# Patient Record
Sex: Male | Born: 1945 | Race: White | Hispanic: No | Marital: Married | Smoking: Former smoker
Health system: Southern US, Community
[De-identification: ages and names within clinical notes are randomized; demographics above are authoritative.]

## PROBLEM LIST (undated history)

## (undated) DIAGNOSIS — E119 Type 2 diabetes mellitus without complications: Secondary | ICD-10-CM

## (undated) DIAGNOSIS — G4730 Sleep apnea, unspecified: Secondary | ICD-10-CM

## (undated) DIAGNOSIS — C801 Malignant (primary) neoplasm, unspecified: Secondary | ICD-10-CM

## (undated) DIAGNOSIS — J449 Chronic obstructive pulmonary disease, unspecified: Secondary | ICD-10-CM

## (undated) DIAGNOSIS — E785 Hyperlipidemia, unspecified: Secondary | ICD-10-CM

## (undated) DIAGNOSIS — H9190 Unspecified hearing loss, unspecified ear: Secondary | ICD-10-CM

## (undated) DIAGNOSIS — I10 Essential (primary) hypertension: Secondary | ICD-10-CM

## (undated) HISTORY — DX: Type 2 diabetes mellitus without complications: E11.9

## (undated) HISTORY — PX: APPENDECTOMY: SHX54

## (undated) HISTORY — PX: THYROIDECTOMY, PARTIAL: SHX18

## (undated) HISTORY — PX: ABDOMINAL AORTIC ANEURYSM REPAIR: SUR1152

## (undated) HISTORY — DX: Unspecified hearing loss, unspecified ear: H91.90

## (undated) HISTORY — DX: Hyperlipidemia, unspecified: E78.5

---

## 2017-11-24 ENCOUNTER — Emergency Department (HOSPITAL_COMMUNITY)
Admission: EM | Admit: 2017-11-24 | Discharge: 2017-11-24 | Disposition: A | Discharge status: Home / Self Care | Payer: Medicare Other | Attending: Emergency Medicine | Admitting: Emergency Medicine

## 2017-11-24 ENCOUNTER — Emergency Department (HOSPITAL_COMMUNITY): Payer: Medicare Other

## 2017-11-24 ENCOUNTER — Encounter (HOSPITAL_COMMUNITY): Payer: Self-pay

## 2017-11-24 ENCOUNTER — Other Ambulatory Visit: Payer: Self-pay

## 2017-11-24 DIAGNOSIS — T887XXA Unspecified adverse effect of drug or medicament, initial encounter: Secondary | ICD-10-CM

## 2017-11-24 DIAGNOSIS — R42 Dizziness and giddiness: Secondary | ICD-10-CM

## 2017-11-24 DIAGNOSIS — Z87891 Personal history of nicotine dependence: Secondary | ICD-10-CM | POA: Diagnosis not present

## 2017-11-24 DIAGNOSIS — E119 Type 2 diabetes mellitus without complications: Secondary | ICD-10-CM | POA: Diagnosis not present

## 2017-11-24 DIAGNOSIS — Z7984 Long term (current) use of oral hypoglycemic drugs: Secondary | ICD-10-CM | POA: Diagnosis not present

## 2017-11-24 DIAGNOSIS — I951 Orthostatic hypotension: Secondary | ICD-10-CM | POA: Insufficient documentation

## 2017-11-24 DIAGNOSIS — T461X5A Adverse effect of calcium-channel blockers, initial encounter: Secondary | ICD-10-CM | POA: Diagnosis not present

## 2017-11-24 DIAGNOSIS — J449 Chronic obstructive pulmonary disease, unspecified: Secondary | ICD-10-CM | POA: Diagnosis not present

## 2017-11-24 DIAGNOSIS — Z7902 Long term (current) use of antithrombotics/antiplatelets: Secondary | ICD-10-CM | POA: Insufficient documentation

## 2017-11-24 DIAGNOSIS — Z79899 Other long term (current) drug therapy: Secondary | ICD-10-CM | POA: Insufficient documentation

## 2017-11-24 DIAGNOSIS — I10 Essential (primary) hypertension: Secondary | ICD-10-CM | POA: Diagnosis not present

## 2017-11-24 HISTORY — DX: Type 2 diabetes mellitus without complications: E11.9

## 2017-11-24 HISTORY — DX: Chronic obstructive pulmonary disease, unspecified: J44.9

## 2017-11-24 HISTORY — DX: Sleep apnea, unspecified: G47.30

## 2017-11-24 HISTORY — DX: Essential (primary) hypertension: I10

## 2017-11-24 LAB — COMPREHENSIVE METABOLIC PANEL
ALBUMIN: 4.4 g/dL (ref 3.5–5.0)
ALT: 30 U/L (ref 0–44)
AST: 27 U/L (ref 15–41)
Alkaline Phosphatase: 47 U/L (ref 38–126)
Anion gap: 12 (ref 5–15)
BILIRUBIN TOTAL: 0.8 mg/dL (ref 0.3–1.2)
BUN: 15 mg/dL (ref 8–23)
CO2: 23 mmol/L (ref 22–32)
Calcium: 9 mg/dL (ref 8.9–10.3)
Chloride: 98 mmol/L (ref 98–111)
Creatinine, Ser: 1.05 mg/dL (ref 0.61–1.24)
GFR calc Af Amer: >60 mL/min (ref >60)
GFR calc non Af Amer: >60 mL/min (ref >60)
GLUCOSE: 260 mg/dL — AB (ref 70–99)
POTASSIUM: 3.8 mmol/L (ref 3.5–5.1)
Sodium: 133 mmol/L — ABNORMAL LOW (ref 135–145)
TOTAL PROTEIN: 7.2 g/dL (ref 6.5–8.1)

## 2017-11-24 LAB — CBC WITH DIFFERENTIAL/PLATELET
ABS IMMATURE GRANULOCYTES: 0.01 10*3/uL (ref 0.00–0.07)
BASOS PCT: 1 %
Basophils Absolute: 0 10*3/uL (ref 0.0–0.1)
Eosinophils Absolute: 0.1 10*3/uL (ref 0.0–0.5)
Eosinophils Relative: 3 %
HEMATOCRIT: 43 % (ref 39.0–52.0)
HEMOGLOBIN: 14.7 g/dL (ref 13.0–17.0)
Immature Granulocytes: 0 %
LYMPHS ABS: 1.1 10*3/uL (ref 0.7–4.0)
LYMPHS PCT: 25 %
MCH: 31.4 pg (ref 26.0–34.0)
MCHC: 34.2 g/dL (ref 30.0–36.0)
MCV: 91.9 fL (ref 80.0–100.0)
MONO ABS: 0.4 10*3/uL (ref 0.1–1.0)
MONOS PCT: 9 %
NEUTROS ABS: 2.8 10*3/uL (ref 1.7–7.7)
Neutrophils Relative %: 62 %
Platelets: 177 10*3/uL (ref 150–400)
RBC: 4.68 MIL/uL (ref 4.22–5.81)
RDW: 13.2 % (ref 11.5–15.5)
WBC: 4.4 10*3/uL (ref 4.0–10.5)
nRBC: 0 % (ref 0.0–0.2)

## 2017-11-24 LAB — TROPONIN I: Troponin I: <0.03 ng/mL (ref <0.03)

## 2017-11-24 MED ORDER — SODIUM CHLORIDE 0.9 % IV BOLUS
500.0000 mL | Freq: Once | INTRAVENOUS | Status: AC
  Administered 2017-11-24: 500 mL via INTRAVENOUS

## 2017-11-24 NOTE — ED Triage Notes (Signed)
Pt awoke with dizziness and tingling/numbness in both hands which has decreased, now is just in left hand. Pt has just been on new BP medication. (Nifedipine 30mg  ER).  Pt reports dizziness is better now (sitting up) and worse when lying down.

## 2017-11-24 NOTE — Discharge Instructions (Addendum)
AVOID THE NIFEDIPINE IN THE FUTURE!!! Keep your appointments this week with your doctors. Please tell them the problems you had on the nifidipine (severe nasal stuffiness, significant low blood pressure, feeling like passing out).  Return to the ED if you feel worse again.

## 2017-11-24 NOTE — ED Provider Notes (Signed)
[name]   CSN: [id number] Arrival date & time: 11/24/17  2355  Time seen 02:53 AM   History   Chief Complaint Chief Complaint  Patient presents with  . Dizziness    HPI Kevin Summers is a 72 y.o. male.  HPI patient states his cardiologist at the [facility] wanted him to start taking nifedipine 30 mg ER about 3 weeks ago and when the patient started that he had a lot of nasal stuffiness and he stopped taking it.  He has an appointment this week in follow-up and he decided to try to take it again tonight around 9:30 PM.  About 12:30 AM he was asleep and he woke up because he could not breathe because of nasal congestion again.  He states when he stood up he felt "woozy".  He had a blood pressure cuff and when he checked his blood pressure it was 80/47 and when he repeated it was 87/50 with heart rate in the 80s.  He states he felt like his vision was dark and he felt lightheaded.  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  This lasted about 30 minutes.  He also describes a posterior dull achy headache that lasted about 20 minutes.  He denies seeing any floaters.  He denied chest pain but states he did feel short of breath when he walked up the stairs from his basement to the first floor which is unusual.  He denies any diaphoresis, numbness or weakness in his legs or feet.  He states when he walked he was off balance but he could move his legs well.  He states currently he feels better however he still feels like his eyesight is off and he still has some fatigue.  He states his blood pressures typically in the 120-130 range and the cardiologist wants his blood pressure to be below 130.  PCP Pomposini, Cherly Anderson, MD   Past Medical History:  Diagnosis Date  . COPD (chronic obstructive pulmonary disease) ([place])   . Diabetes mellitus without complication ([place])   .  Hypertension   . Sleep apnea     There are no active problems to display for this patient.   Past Surgical History:  Procedure Laterality Date  . ABDOMINAL AORTIC ANEURYSM REPAIR    . APPENDECTOMY          Home Medications    Prior to Admission medications   Medication Sig Start Date End Date Taking? Authorizing Provider  ferrous sulfate 325 (65 FE) MG EC tablet Take 2 tablets by mouth 2 (two) times daily.   Yes [provider]  levothyroxine (SYNTHROID) 50 MCG tablet Take 50 mcg by mouth daily. 09/14/11  Yes [provider]  metFORMIN (GLUCOPHAGE) 1000 MG tablet Take 1,000 mg by mouth 2 (two) times daily with a meal.   Yes [provider]  metoprolol tartrate (LOPRESSOR) 25 MG tablet Take 25 mg by mouth 2 (two) times daily.   Yes [provider]  alfuzosin (UROXATRAL) 10 MG 24 hr tablet Take 10 mg by mouth at bedtime. 11/07/17   [provider]  atorvastatin (LIPITOR) 40 MG tablet Take 40 mg by mouth daily at 8 pm. 09/08/17   [provider]  clopidogrel (PLAVIX) 75 MG tablet Take 75 mg by mouth daily. 09/22/17   [provider]  ezetimibe (ZETIA) 10 MG tablet Take  10 mg by mouth daily. 11/13/17   [provider]  glipiZIDE (GLUCOTROL) 5 MG tablet Take 5 mg by mouth daily. 09/08/17   [provider]  irbesartan-hydrochlorothiazide (AVALIDE) 300-12.5 MG tablet Take 1 tablet by mouth daily. 10/03/17   [provider]  JANUVIA 100 MG tablet Take 100 mg by mouth at bedtime. 10/21/17   [provider]  JARDIANCE 10 MG TABS tablet Take 10 mg by mouth daily. 10/06/17   [provider]  NIFEdipine (ADALAT CC) 30 MG 24 hr tablet Take 30 mg by mouth at bedtime. 08/26/17   [provider]  omeprazole (PRILOSEC) 20 MG capsule Take 20 mg by mouth at bedtime. 09/08/17   [provider]  pioglitazone (ACTOS) 15 MG tablet Take 15 mg by mouth at bedtime. 10/03/17   [provider]      Family History No family history on file.  Social History Social History   Tobacco Use  . Smoking status: Former Smoker    Packs/day: 1.00    Years: 25.00    Pack years: 25.00    Types: Cigarettes    Last attempt to quit: 11/24/2016    Years since quitting: 1.0  . Smokeless tobacco: Never Used  Substance Use Topics  . Alcohol use: Yes    Comment: "couple a day"  . Drug use: Never  lives at home   Allergies   Patient has no known allergies.   Review of Systems Review of Systems  All other systems reviewed and are negative.    Physical Exam Updated Vital Signs BP 135/64 (BP Location: Left Arm)   Pulse 75   Temp 97.6 F (36.4 C) (Oral)   Resp 20   Ht 5\' 10"  (1.778 m)   Wt 106.6 kg   SpO2 91%   BMI 33.72 kg/m   Physical Exam  Constitutional: He is oriented to person, place, and time. He appears well-developed and well-nourished.  Non-toxic appearance. He does not appear ill. No distress.  HENT:  Head: Normocephalic and atraumatic.  Right Ear: External ear normal.  Left Ear: External ear normal.  Nose: Nose normal. No mucosal edema or rhinorrhea.  Mouth/Throat: Oropharynx is clear and moist and mucous membranes are normal. No dental abscesses or uvula swelling.  Eyes: Pupils are equal, round, and reactive to light. Conjunctivae and EOM are normal.  Neck: Normal range of motion and full passive range of motion without pain. Neck supple.  Cardiovascular: Normal rate, regular rhythm and normal heart sounds. Exam reveals no gallop and no friction rub.  No murmur heard. Pulmonary/Chest: Effort normal and breath sounds normal. No respiratory distress. He has no wheezes. He has no rhonchi. He has no rales. He exhibits no tenderness and no crepitus.  Abdominal: Soft. Normal appearance and bowel sounds are normal. He exhibits no distension. There is no tenderness. There is no rebound and no guarding.  Musculoskeletal: Normal range of motion. He exhibits no edema or  tenderness.  Moves all extremities well.   Neurological: He is alert and oriented to person, place, and time. He has normal strength. No cranial nerve deficit.  Grips are equal, there is no pronator drift, he has some difficulty raising his left leg up off the stretcher however he has had some problems with that leg before hand.  Skin: Skin is warm, dry and intact. No rash noted. No erythema. No pallor.  Psychiatric: He has a normal mood and affect. His speech is normal and behavior is normal. His  mood appears not anxious.  Nursing note and vitals reviewed.  Orthostatic VS for the past 24 hrs:  BP- Lying Pulse- Lying BP- Sitting Pulse- Sitting BP- Standing at 0 minutes Pulse- Standing at 0 minutes  11/24/17 0331 110/57 80 112/57 84 98/57 87   Orthostatic vital signs show significant drop on standing with his systolic pressures  54:00:86 Vital Signs HP   Vital Signs  BP: 140/63   MAP (mmHg): 83   BP Location: Left Arm   BP Method: Automatic   Patient Position (if appropriate): Lying    05:37:18 Vital Signs HP   Vital Signs  Pulse Rate: 84   Pulse Rate Source: Monitor   ECG Heart Rate: 82   BP: 142/63Abnormal    MAP (mmHg): 80   BP Location: Left Arm   BP Method: Automatic   Patient Position (if appropriate): Sitting    05:41:13 Vital Signs HP  Vital Signs  Pulse Rate: 75   Pulse Rate Source: Monitor   BP: 135/64   MAP (mmHg): 82   BP Location: Left Arm   BP Method: Automatic   Patient Position (if appropriate): Standing    Repeat orthostatic vital signs after IV fluids are much improved with no more orthostasis.  ED Treatments / Results  Labs (all labs ordered are listed, but only abnormal results are displayed) Results for orders placed or performed during the hospital encounter of 11/24/17  Comprehensive metabolic panel  Result Value Ref Range   Sodium 133 (L) 135 - 145 mmol/L   Potassium 3.8 3.5 - 5.1 mmol/L   Chloride 98 98 - 111 mmol/L   CO2 23 22 - 32 mmol/L    Glucose, Bld 260 (H) 70 - 99 mg/dL   BUN 15 8 - 23 mg/dL   Creatinine, Ser 1.05 0.61 - 1.24 mg/dL   Calcium 9.0 8.9 - 10.3 mg/dL   Total Protein 7.2 6.5 - 8.1 g/dL   Albumin 4.4 3.5 - 5.0 g/dL   AST 27 15 - 41 U/L   ALT 30 0 - 44 U/L   Alkaline Phosphatase 47 38 - 126 U/L   Total Bilirubin 0.8 0.3 - 1.2 mg/dL   GFR calc non Af Amer >60 >60 mL/min   GFR calc Af Amer >60 >60 mL/min   Anion gap 12 5 - 15  CBC with Differential  Result Value Ref Range   WBC 4.4 4.0 - 10.5 K/uL   RBC 4.68 4.22 - 5.81 MIL/uL   Hemoglobin 14.7 13.0 - 17.0 g/dL   HCT 43.0 39.0 - 52.0 %   MCV 91.9 80.0 - 100.0 fL   MCH 31.4 26.0 - 34.0 pg   MCHC 34.2 30.0 - 36.0 g/dL   RDW 13.2 11.5 - 15.5 %   Platelets 177 150 - 400 K/uL   nRBC 0.0 0.0 - 0.2 %   Neutrophils Relative % 62 %   Neutro Abs 2.8 1.7 - 7.7 K/uL   Lymphocytes Relative 25 %   Lymphs Abs 1.1 0.7 - 4.0 K/uL   Monocytes Relative 9 %   Monocytes Absolute 0.4 0.1 - 1.0 K/uL   Eosinophils Relative 3 %   Eosinophils Absolute 0.1 0.0 - 0.5 K/uL   Basophils Relative 1 %   Basophils Absolute 0.0 0.0 - 0.1 K/uL   Immature Granulocytes 0 %   Abs Immature Granulocytes 0.01 0.00 - 0.07 K/uL  Troponin I  Result Value Ref Range   Troponin I <0.03 <0.03 ng/mL  Laboratory interpretation all normal except hyperglycemia     EKG EKG Interpretation  Date/Time:  Sunday November 24 2017 02:50:36 EDT Ventricular Rate:  81 PR Interval:    QRS Duration: 84 QT Interval:  351 QTC Calculation: 408 R Axis:   38 Text Interpretation:  Sinus rhythm Nonspecific ST abnormality No old tracing to compare Confirmed by Rolland Porter [phone] on 11/24/2017 2:54:42 AM   Radiology Ct Head Wo Contrast  Result Date: 11/24/2017 CLINICAL DATA:  Dizziness and headache EXAM: CT HEAD WITHOUT CONTRAST TECHNIQUE: Contiguous axial images were obtained from the base of the skull through the vertex without intravenous contrast. COMPARISON:  None. FINDINGS: Brain: There is  no mass, hemorrhage or extra-axial collection. The size and configuration of the ventricles and extra-axial CSF spaces are normal. There is no acute or chronic infarction. The brain parenchyma is normal. Vascular: No abnormal hyperdensity of the major intracranial arteries or dural venous sinuses. No intracranial atherosclerosis. Skull: The visualized skull base, calvarium and extracranial soft tissues are normal. Sinuses/Orbits: No fluid levels or advanced mucosal thickening of the visualized paranasal sinuses. No mastoid or middle ear effusion. The orbits are normal. IMPRESSION: Normal aging brain. Electronically Signed   By: Ulyses Jarred M.D.   On: 11/24/2017 04:39    Procedures Procedures (including critical care time)  Medications Ordered in ED Medications  sodium chloride 0.9 % bolus 500 mL (0 mLs Intravenous Stopped 11/24/17 0534)     Initial Impression / Assessment and Plan / ED Course  I have reviewed the triage vital signs and the nursing notes.  Pertinent labs & imaging results that were available during my care of the patient were reviewed by me and considered in my medical decision making (see chart for details).    It appears that patient probably had a significant orthostatic orthostasis episode after taking the nifedipine.  This was verified by his orthostatic vital signs.  Basic laboratory testing was done including troponin and he had a EKG and CT of the head done.  When I reviewed his orthostatic vital signs he was given a 500 cc bolus of normal saline.  Afterwards his blood pressures and pulses were much improved.  He states he is feeling better.  At this time I do not suspect that he has had a stroke.  I think he was symptomatic from his hypotension that he had at home from the nifedipine.  He is advised to stop it and talk to his cardiologist about some other way to control his blood pressure.    Final Clinical Impressions(s) / ED Diagnoses   Final diagnoses:    Orthostasis  Lightheadedness  Dizziness  Side effect of medication    ED Discharge Orders    None      Plan discharge  Rolland Porter, MD, Barbette Or, MD 11/24/17 0600

## 2018-02-18 ENCOUNTER — Ambulatory Visit: Payer: Self-pay | Admitting: "Endocrinology

## 2018-02-19 ENCOUNTER — Encounter: Payer: Self-pay | Admitting: "Endocrinology

## 2018-02-19 ENCOUNTER — Ambulatory Visit (INDEPENDENT_AMBULATORY_CARE_PROVIDER_SITE_OTHER): Payer: Medicare Other | Admitting: "Endocrinology

## 2018-02-19 VITALS — BP 107/68 | HR 69 | Ht 70.0 in | Wt 247.0 lb

## 2018-02-19 DIAGNOSIS — E042 Nontoxic multinodular goiter: Secondary | ICD-10-CM | POA: Diagnosis not present

## 2018-02-19 DIAGNOSIS — E890 Postprocedural hypothyroidism: Secondary | ICD-10-CM | POA: Diagnosis not present

## 2018-02-19 LAB — TSH: TSH: 0.37 — AB (ref 0.41–5.90)

## 2018-02-19 NOTE — Progress Notes (Signed)
Endocrinology Consult Note                                            02/19/2018, 3:38 PM   Subjective:    Patient ID: Kevin Summers, male    DOB: 02/23/1945, PCP Pomposini, Cherly Anderson, MD   Past Medical History:  Diagnosis Date  . COPD (chronic obstructive pulmonary disease) ([place])   . Diabetes mellitus without complication ([place])   . Diabetes mellitus, type II ([place])   . HOH (hard of hearing)   . Hyperlipidemia   . Hypertension   . Sleep apnea    Past Surgical History:  Procedure Laterality Date  . ABDOMINAL AORTIC ANEURYSM REPAIR    . APPENDECTOMY    . THYROIDECTOMY, PARTIAL     Social History   Socioeconomic History  . Marital status: Married    Spouse name: Not on file  . Number of children: Not on file  . Years of education: Not on file  . Highest education level: Not on file  Occupational History  . Not on file  Social Needs  . Financial resource strain: Not on file  . Food insecurity:    Worry: Not on file    Inability: Not on file  . Transportation needs:    Medical: Not on file    Non-medical: Not on file  Tobacco Use  . Smoking status: Former Smoker    Packs/day: 1.00    Years: 25.00    Pack years: 25.00    Types: Cigarettes    Last attempt to quit: 11/24/2016    Years since quitting: 1.2  . Smokeless tobacco: Never Used  Substance and Sexual Activity  . Alcohol use: Yes    Comment: "couple a day"  . Drug use: Never  . Sexual activity: Not on file  Lifestyle  . Physical activity:    Days per week: Not on file    Minutes per session: Not on file  . Stress: Not on file  Relationships  . Social connections:    Talks on phone: Not on file    Gets together: Not on file    Attends religious service: Not on file    Active member of club or organization: Not on file    Attends meetings of clubs or organizations: Not on file    Relationship status: Not on file  Other Topics Concern  . Not on file  Social History Narrative  . Not on  file   Outpatient Encounter Medications as of 02/19/2018  Medication Sig  . alfuzosin (UROXATRAL) 10 MG 24 hr tablet Take 10 mg by mouth at bedtime.  Marland Kitchen aspirin EC 81 MG tablet Take 81 mg by mouth daily.  Marland Kitchen atorvastatin (LIPITOR) 40 MG tablet Take 40 mg by mouth daily at 8 pm.  . Cholecalciferol (VITAMIN D) 50 MCG (2000 UT) tablet Take 2,000 Units by mouth daily.  . clopidogrel (PLAVIX) 75 MG tablet Take 75 mg by mouth daily.  Marland Kitchen ezetimibe (ZETIA) 10 MG tablet Take 10 mg by mouth daily.  . ferrous sulfate 325 (65 FE) MG EC tablet Take 2 tablets by mouth 2 (two) times daily.  Marland Kitchen glipiZIDE (GLUCOTROL) 5 MG tablet Take 5 mg by mouth daily.  . irbesartan-hydrochlorothiazide (AVALIDE) 300-12.5 MG tablet Take 1 tablet by mouth daily.  Marland Kitchen JANUVIA 100 MG tablet Take 100  mg by mouth at bedtime.  Marland Kitchen JARDIANCE 10 MG TABS tablet Take 10 mg by mouth daily.  . metFORMIN (GLUCOPHAGE) 1000 MG tablet Take 1,000 mg by mouth 2 (two) times daily with a meal.  . metoprolol tartrate (LOPRESSOR) 25 MG tablet Take 12.5 mg by mouth 2 (two) times daily.   Marland Kitchen omeprazole (PRILOSEC) 20 MG capsule Take 20 mg by mouth at bedtime.  . pioglitazone (ACTOS) 15 MG tablet Take 15 mg by mouth at bedtime.  . [DISCONTINUED] ferrous sulfate 325 (65 FE) MG EC tablet Take 325 mg by mouth 3 (three) times daily with meals.  Marland Kitchen NIFEdipine (ADALAT CC) 30 MG 24 hr tablet Take 30 mg by mouth at bedtime.  . [DISCONTINUED] clopidogrel (PLAVIX) 75 MG tablet Take 75 mg by mouth daily.  . [DISCONTINUED] levothyroxine (SYNTHROID) 50 MCG tablet Take 50 mcg by mouth daily.   No facility-administered encounter medications on file as of 02/19/2018.    ALLERGIES: No Known Allergies  VACCINATION STATUS:  There is no immunization history on file for this patient.  HPI Kevin Summers is 73 y.o. male who presents today with a medical history as above. he is being seen in consultation for history of postsurgical hypothyroidism requested by Pomposini,  Cherly Anderson, MD.   His medical history includes partial thyroidectomy (right hemithyroidectomy) for toxic multinodular goiter approximately 2010 per his report.  He was given various dose of Synthroid subsequent to his surgery until approximately 3 months ago when he was advised to stop due to suppressed TSH.  He does not remember the last dose of his Synthroid he has taken.  -He has noticed recent progressive weight gain, feeling tired, dry skin, and cold intolerance. No recent thyroid function test to review.  He denies history of thyroid malignancy nor family history of thyroid malignancy.  He is a former smoker quit in 2018.  He denies dysphagia, odynophagia, nor shortness of breath.  Review of Systems  Constitutional: + weight gain, + fatigue, no subjective hyperthermia, no subjective hypothermia Eyes: no blurry vision, no xerophthalmia ENT: no sore throat, no nodules palpated in throat,no dysphagia/odynophagia, no hoarseness Cardiovascular: no Chest Pain, no Shortness of Breath, no palpitations, no leg swelling Respiratory: no cough, no SOB Gastrointestinal: no Nausea/Vomiting/Diarhhea Musculoskeletal: no muscle/joint aches Skin: no rashes Neurological: no tremors, no numbness, no tingling, no dizziness Psychiatric: no depression, no anxiety  Objective:    BP 107/68   Pulse 69   Ht 5\' 10"  (1.778 m)   Wt 247 lb (112 kg)   BMI 35.44 kg/m   Wt Readings from Last 3 Encounters:  02/19/18 247 lb (112 kg)  11/24/17 235 lb (106.6 kg)    Physical Exam  Constitutional: + obese, not in acute distress, normal state of mind Eyes: PERRLA, EOMI, no exophthalmos ENT: moist mucous membranes,   + thyroidectomy scar on anterior lower neck, palpable left lobe of the thyroid , no cervical lymphadenopathy Cardiovascular: normal precordial activity, Regular Rate and Rhythm, no Murmur/Rubs/Gallops Respiratory:  adequate breathing efforts, no gross chest deformity, Clear to auscultation  bilaterally Gastrointestinal: abdomen soft, Non -tender, No distension, Bowel Sounds present Musculoskeletal: no gross deformities, strength intact in all four extremities Skin: moist, warm, no rashes Neurological: no tremor with outstretched hands, Deep tendon reflexes normal in all four extremities.  CMP ( most recent) CMP     Component Value Date/Time   NA 133 (L) 11/24/2017 0339   K 3.8 11/24/2017 0339   CL 98 11/24/2017 0339   CO2  23 11/24/2017 0339   GLUCOSE 260 (H) 11/24/2017 0339   BUN 15 11/24/2017 0339   CREATININE 1.05 11/24/2017 0339   CALCIUM 9.0 11/24/2017 0339   PROT 7.2 11/24/2017 0339   ALBUMIN 4.4 11/24/2017 0339   AST 27 11/24/2017 0339   ALT 30 11/24/2017 0339   ALKPHOS 47 11/24/2017 0339   BILITOT 0.8 11/24/2017 0339   GFRNONAA >60 11/24/2017 0339   GFRAA >60 11/24/2017 0339   Thyroid ultrasound on Jul 03, 2017 showed absent right lobe of thyroid, 6.3 cm left lobe with no discrete nodules. Thyroid uptake and scan in November 2019 showed 12% 24-hour uptake.  December 05, 2017 labs showed oral T4 8.1, TSH suppressed at 0.28   Assessment & Plan:   1.  History of multinodular goiter 2.  History of postsurgical hypothyroidism   - Jacub Waiters  is being seen at a kind request of Pomposini, Cherly Anderson, MD. - I have reviewed his available thyroid records and clinically evaluated the patient. - Based on reviews, he has underwent right hemithyroidectomy in 2010 for what appears to be toxic multinodular goiter requiring some thyroid hormone supplement subsequently until 3 months ago.  He was advised to stop Synthroid due to suppressed TSH.  He has not taken any thyroid hormone for the last 3 months.  He has seen some symptoms suggestive of hypothyroidism.  He will be sent for new set of thyroid function tests including TSH, free T4/free T3 as well as CMP.  Will return in 10 days to review results and treatment decisions if necessary. - I did not initiate any new  prescriptions today. - I advised him  to maintain close follow up with Pomposini, Cherly Anderson, MD for primary care needs.   - Time spent with the patient: 35 minutes, of which >50% was spent in obtaining information about his symptoms, reviewing his previous labs, evaluations, and treatments, counseling him about his partial thyroidectomy with history of postsurgical hypothyroidism, and developing a plan to confirm the diagnosis and long term treatment as necessary.  Kevin Summers participated in the discussions, expressed understanding, and voiced agreement with the above plans.  All questions were answered to his satisfaction. he is encouraged to contact clinic should he have any questions or concerns prior to his return visit.  Follow up plan: Return in about 10 days (around 03/01/2018) for Labs Today- Non-Fasting Ok.   Glade Lloyd, MD [facility] [address] [place], [place] [address] Phone: [phone]  Fax: [phone]     02/19/2018, 3:38 PM  This note was partially dictated with voice recognition software. Similar sounding words can be transcribed inadequately or may not  be corrected upon review.

## 2018-03-11 ENCOUNTER — Encounter: Payer: Self-pay | Admitting: "Endocrinology

## 2018-03-11 ENCOUNTER — Ambulatory Visit (INDEPENDENT_AMBULATORY_CARE_PROVIDER_SITE_OTHER): Payer: Medicare Other | Admitting: "Endocrinology

## 2018-03-11 VITALS — BP 110/74 | HR 76 | Ht 70.0 in | Wt 247.0 lb

## 2018-03-11 DIAGNOSIS — E890 Postprocedural hypothyroidism: Secondary | ICD-10-CM

## 2018-03-11 DIAGNOSIS — E042 Nontoxic multinodular goiter: Secondary | ICD-10-CM

## 2018-03-11 NOTE — Progress Notes (Signed)
Endocrinology follow-up  Note                                            03/11/2018, 2:10 PM   Subjective:    Patient ID: Kevin Summers, male    DOB: 07-Apr-1945, PCP Pomposini, Cherly Anderson, MD   Past Medical History:  Diagnosis Date  . COPD (chronic obstructive pulmonary disease) ([place])   . Diabetes mellitus without complication ([place])   . Diabetes mellitus, type II ([place])   . HOH (hard of hearing)   . Hyperlipidemia   . Hypertension   . Sleep apnea    Past Surgical History:  Procedure Laterality Date  . ABDOMINAL AORTIC ANEURYSM REPAIR    . APPENDECTOMY    . THYROIDECTOMY, PARTIAL     Social History   Socioeconomic History  . Marital status: Married    Spouse name: Not on file  . Number of children: Not on file  . Years of education: Not on file  . Highest education level: Not on file  Occupational History  . Not on file  Social Needs  . Financial resource strain: Not on file  . Food insecurity:    Worry: Not on file    Inability: Not on file  . Transportation needs:    Medical: Not on file    Non-medical: Not on file  Tobacco Use  . Smoking status: Former Smoker    Packs/day: 1.00    Years: 25.00    Pack years: 25.00    Types: Cigarettes    Last attempt to quit: 11/24/2016    Years since quitting: 1.2  . Smokeless tobacco: Never Used  Substance and Sexual Activity  . Alcohol use: Yes    Comment: "couple a day"  . Drug use: Never  . Sexual activity: Not on file  Lifestyle  . Physical activity:    Days per week: Not on file    Minutes per session: Not on file  . Stress: Not on file  Relationships  . Social connections:    Talks on phone: Not on file    Gets together: Not on file    Attends religious service: Not on file    Active member of club or organization: Not on file    Attends meetings of clubs or organizations: Not on file    Relationship status: Not on file  Other Topics Concern  . Not on file  Social History Narrative  . Not on  file   Outpatient Encounter Medications as of 03/11/2018  Medication Sig  . alfuzosin (UROXATRAL) 10 MG 24 hr tablet Take 10 mg by mouth daily with breakfast.   . aspirin EC 81 MG tablet Take 81 mg by mouth daily.  Marland Kitchen atorvastatin (LIPITOR) 40 MG tablet Take 40 mg by mouth daily at 8 pm.  . Cholecalciferol (VITAMIN D) 50 MCG (2000 UT) tablet Take 2,000 Units by mouth daily.  . clopidogrel (PLAVIX) 75 MG tablet Take 75 mg by mouth daily.  Marland Kitchen ezetimibe (ZETIA) 10 MG tablet Take 10 mg by mouth daily.  . ferrous sulfate 325 (65 FE) MG EC tablet Take 2 tablets by mouth 2 (two) times daily.  Marland Kitchen glipiZIDE (GLUCOTROL) 5 MG tablet Take 5 mg by mouth daily.  . irbesartan-hydrochlorothiazide (AVALIDE) 300-12.5 MG tablet Take 1 tablet by mouth daily.  Marland Kitchen JANUVIA 100 MG  tablet Take 100 mg by mouth at bedtime.  Marland Kitchen JARDIANCE 10 MG TABS tablet Take 10 mg by mouth daily.  . metFORMIN (GLUCOPHAGE) 1000 MG tablet Take 1,000 mg by mouth 2 (two) times daily with a meal.  . metoprolol tartrate (LOPRESSOR) 25 MG tablet Take 12.5 mg by mouth 2 (two) times daily.   Marland Kitchen NIFEdipine (ADALAT CC) 30 MG 24 hr tablet Take 30 mg by mouth daily.   Marland Kitchen omeprazole (PRILOSEC) 20 MG capsule Take 20 mg by mouth daily.   . pioglitazone (ACTOS) 15 MG tablet Take 15 mg by mouth daily.    No facility-administered encounter medications on file as of 03/11/2018.    ALLERGIES: No Known Allergies  VACCINATION STATUS:  There is no immunization history on file for this patient.  HPI Kevin Summers is 73 y.o. male who presents today with a medical history as above. he is returning with repeat thyroid function tests after he was seen in consultation for history of postsurgical hypothyroidism.    His medical history includes partial thyroidectomy (right hemithyroidectomy) for toxic multinodular goiter approximately 2010 per his report.  He was given various dose of Synthroid subsequent to his surgery until approximately 3 months ago when he was  advised to stop due to suppressed TSH.  He does not remember the last dose of his Synthroid he has taken.  -He has noticed recent progressive weight gain, feeling tired, dry skin, and cold intolerance. No recent thyroid function test to review.  He denies history of thyroid malignancy nor family history of thyroid malignancy.  He is a former smoker quit in 2018.  He denies dysphagia, odynophagia, nor shortness of breath.  Review of Systems  Constitutional: + Steady weight,  no subjective hyperthermia, no subjective hypothermia Eyes: no blurry vision, no xerophthalmia ENT: no sore throat, no nodules palpated in throat,no dysphagia/odynophagia, no hoarseness Musculoskeletal: no muscle/joint aches Skin: no rashes Neurological: no tremors, no numbness, no tingling, no dizziness Psychiatric: no depression, no anxiety  Objective:    BP 110/74   Pulse 76   Ht 5\' 10"  (1.778 m)   Wt 247 lb (112 kg)   BMI 35.44 kg/m   Wt Readings from Last 3 Encounters:  03/11/18 247 lb (112 kg)  02/19/18 247 lb (112 kg)  11/24/17 235 lb (106.6 kg)    Physical Exam  Constitutional: + obese, not in acute distress, normal state of mind Eyes: PERRLA, EOMI, no exophthalmos ENT: moist mucous membranes,   + thyroidectomy scar on anterior lower neck, palpable left lobe of the thyroid , no cervical lymphadenopathy Cardiovascular: normal precordial activity, Regular Rate and Rhythm, no Murmur/Rubs/Gallops Respiratory:  adequate breathing efforts, no gross chest deformity, Clear to auscultation bilaterally Gastrointestinal: abdomen soft, Non -tender, No distension, Bowel Sounds present Musculoskeletal: no gross deformities, strength intact in all four extremities Skin: moist, warm, no rashes Neurological: no tremor with outstretched hands, Deep tendon reflexes normal in all four extremities.  CMP ( most recent) CMP     Component Value Date/Time   NA 133 (L) 11/24/2017 0339   K 3.8 11/24/2017 0339   CL 98  11/24/2017 0339   CO2 23 11/24/2017 0339   GLUCOSE 260 (H) 11/24/2017 0339   BUN 15 11/24/2017 0339   CREATININE 1.05 11/24/2017 0339   CALCIUM 9.0 11/24/2017 0339   PROT 7.2 11/24/2017 0339   ALBUMIN 4.4 11/24/2017 0339   AST 27 11/24/2017 0339   ALT 30 11/24/2017 0339   ALKPHOS 47 11/24/2017 0339  BILITOT 0.8 11/24/2017 0339   GFRNONAA >60 11/24/2017 0339   GFRAA >60 11/24/2017 0339   Thyroid ultrasound on Jul 03, 2017 showed absent right lobe of thyroid, 6.3 cm left lobe with no discrete nodules. Thyroid uptake and scan in November 2019 showed 12% 24-hour uptake.  December 05, 2017 labs showed oral T4 8.1, TSH suppressed at 0.28  Recent Results (from the past 2160 hour(s))  TSH     Status: Abnormal   Collection Time: 02/19/18 12:00 AM  Result Value Ref Range   TSH 0.37 (A) 0.41 - 5.90    Comment: Free T3 3.0, free T4 1.01     Assessment & Plan:   1.  History of multinodular goiter 2.  History of postsurgical hypothyroidism  -Based on his repeat thyroid function test, he will not require any intervention with thyroid hormone nor antithyroid treatment. He is advised to return in 1 year with repeat thyroid function tests and surveillance thyroid/neck ultrasound. - I did not initiate any new prescriptions today. - I advised him  to maintain close follow up with Pomposini, Cherly Anderson, MD for primary care needs.  Follow up plan: Return in about 1 year (around 03/12/2019) for Follow up with Pre-visit Labs, Thyroid / Neck Ultrasound.   Glade Lloyd, MD [facility] [address] [place], [place] [address] Phone: [phone]  Fax: [phone]     03/11/2018, 2:10 PM  This note was partially dictated with voice recognition software. Similar sounding words can be transcribed inadequately or may not  be corrected upon review.

## 2019-03-12 ENCOUNTER — Ambulatory Visit: Payer: Medicare Other | Admitting: "Endocrinology

## 2020-03-30 IMAGING — CT CT HEAD W/O CM
3 series · 15 of 47 positions shown, 18 images · non-contrast
Comparison: None.

CLINICAL DATA: Dizziness and headache

EXAM:
CT HEAD WITHOUT CONTRAST
TECHNIQUE: Contiguous axial images were obtained from the base of the skull
through the vertex without intravenous contrast.

[Series 2: head wo · axial · 0.44mm/px · z∈[+210,+340]mm · 9 of 32 slices shown, 12 images]
[im 3/32  brain]
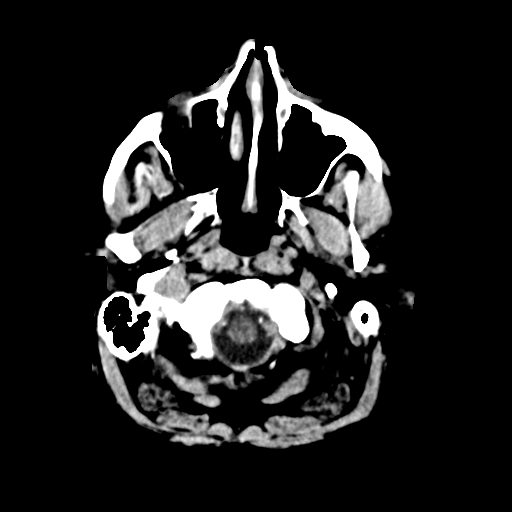
[im 3/32  bone]
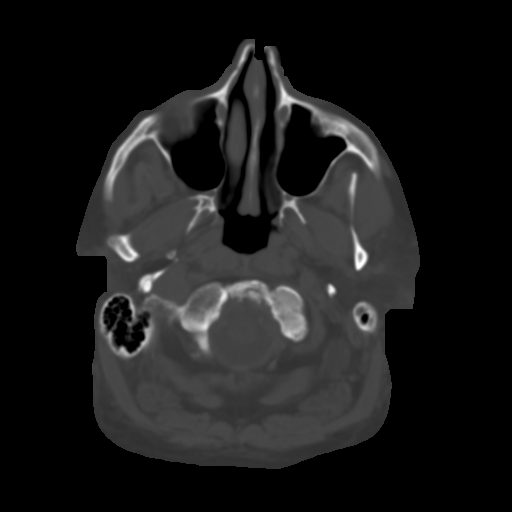
[im 6/32  brain]
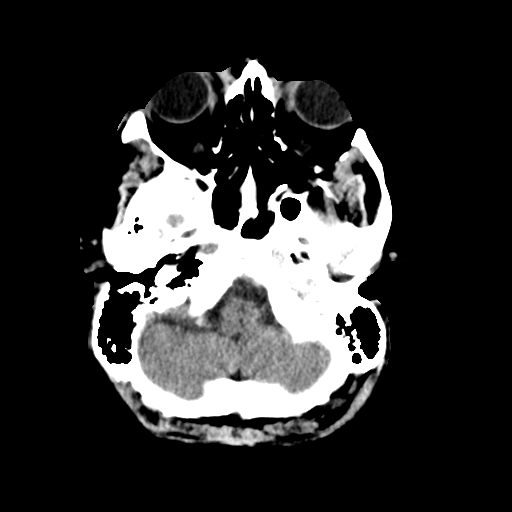
[im 9/32  brain]
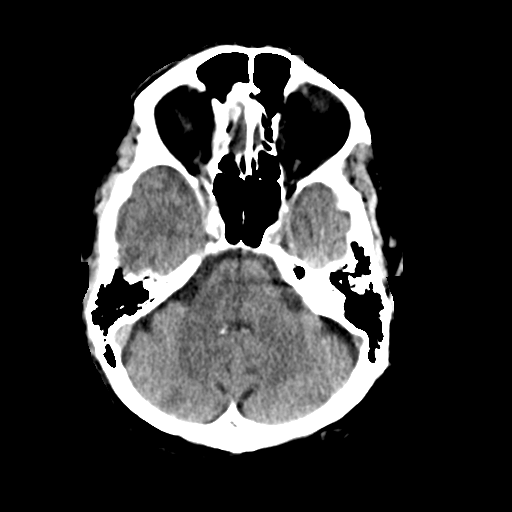
[im 12/32  brain]
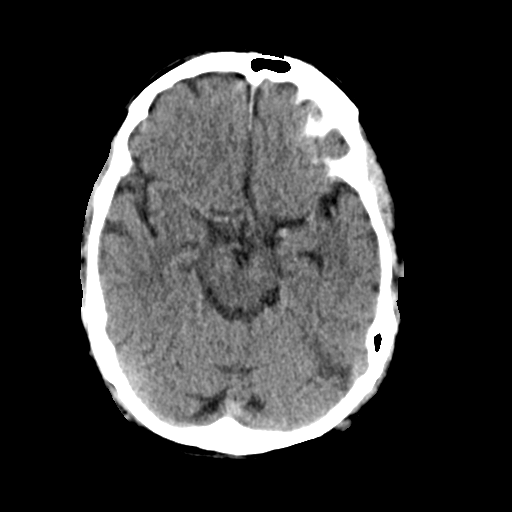
[im 17/32  brain]
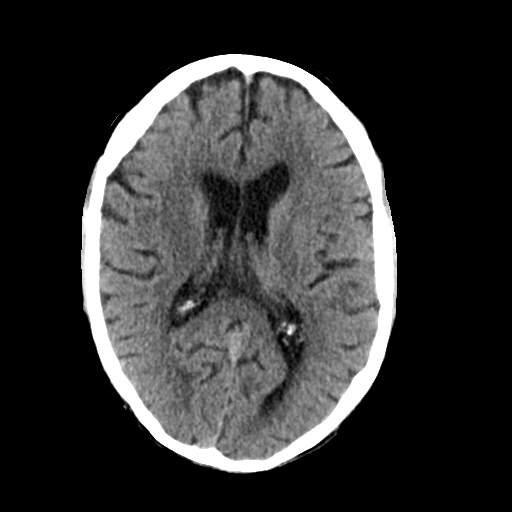
[im 17/32  bone]
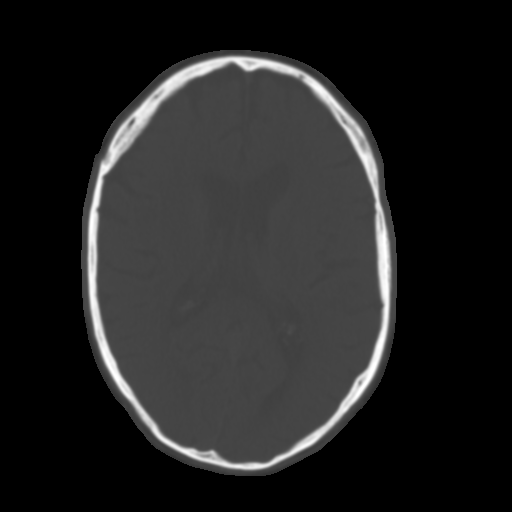
[im 20/32  brain]
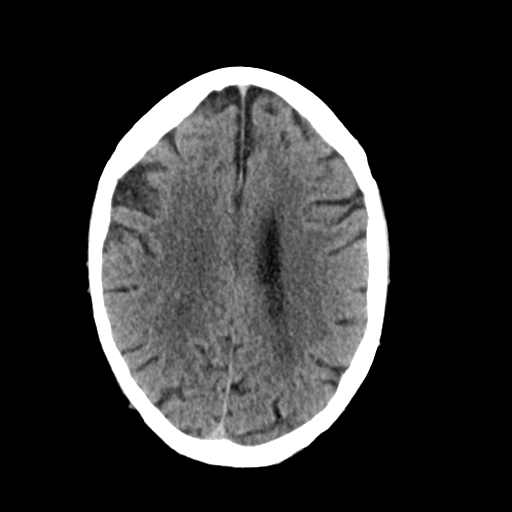
[im 23/32  brain]
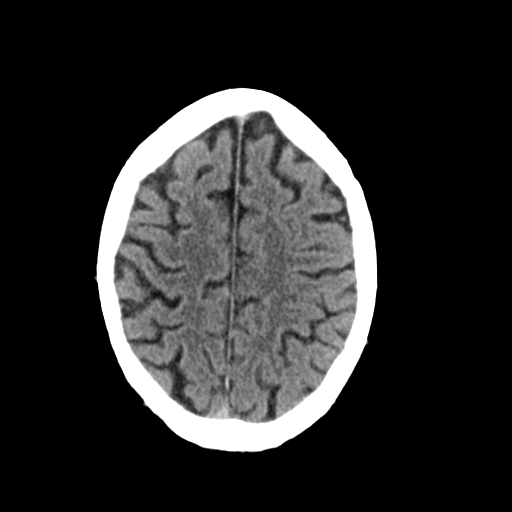
[im 26/32  brain]
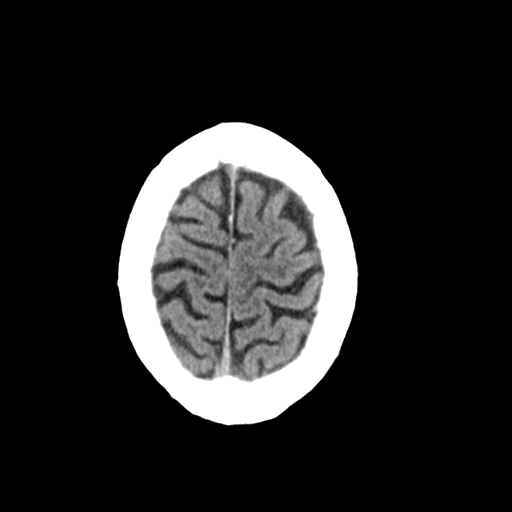
[im 29/32  brain]
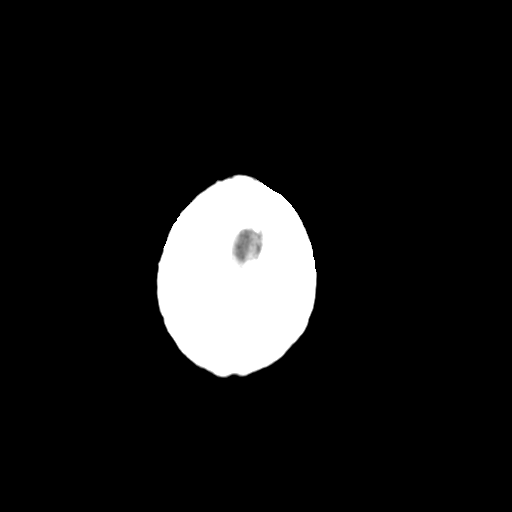
[im 29/32  bone]
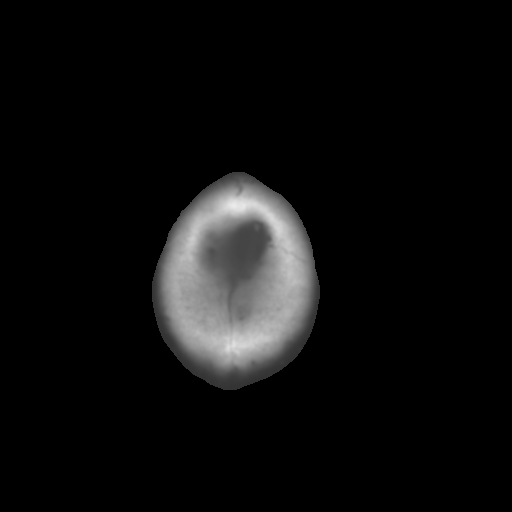

[Series 4: coronal soft tissue · coronal · 0.35mm/px · 3 of 72 slices shown]
[im 24/72  brain]
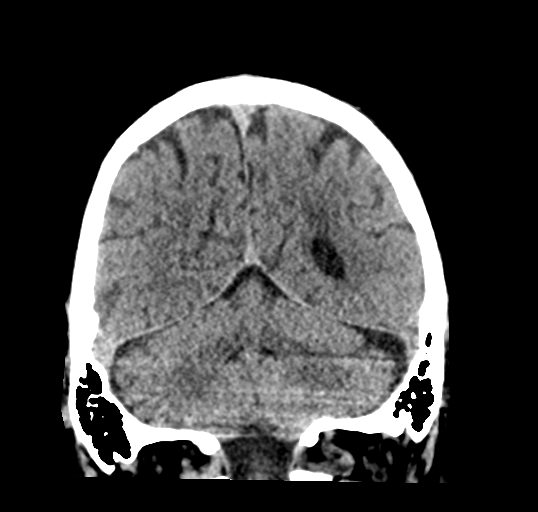
[im 32/72  brain]
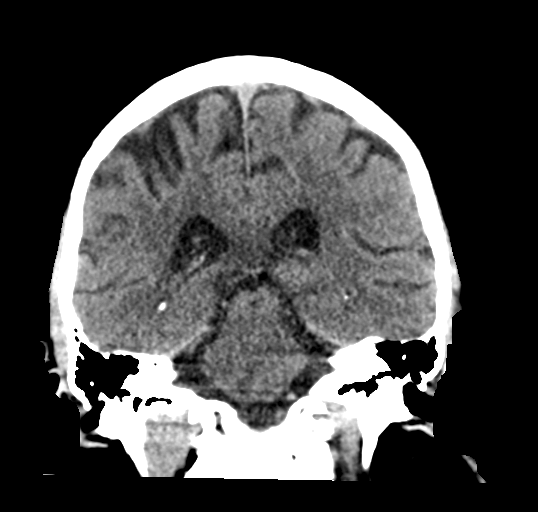
[im 40/72  brain]
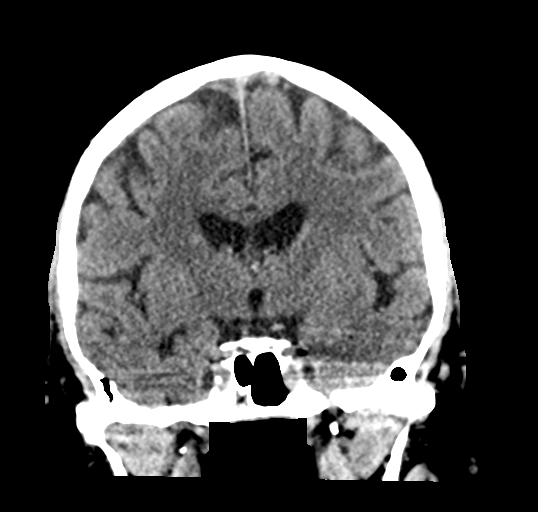

[Series 5: sagittal soft tissue · sagittal · 0.33mm/px · 3 of 60 slices shown]
[im 20/60  brain]
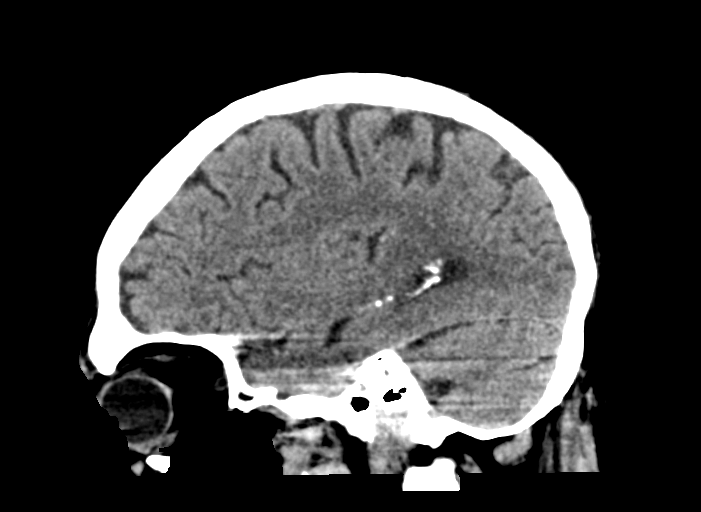
[im 30/60  brain]
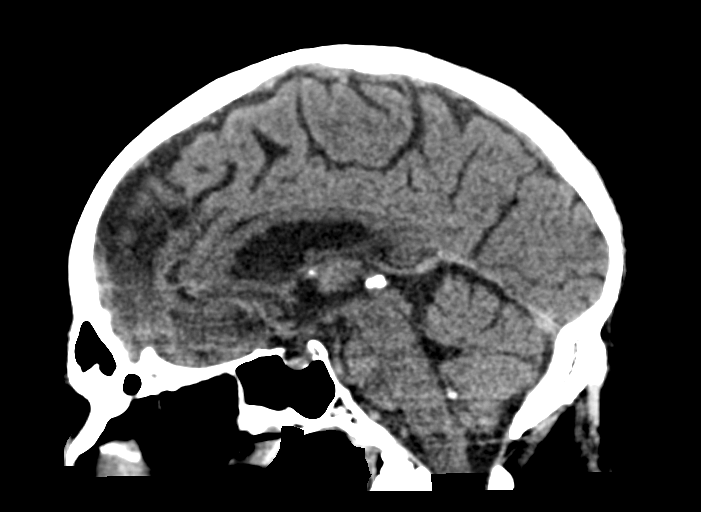
[im 40/60  brain]
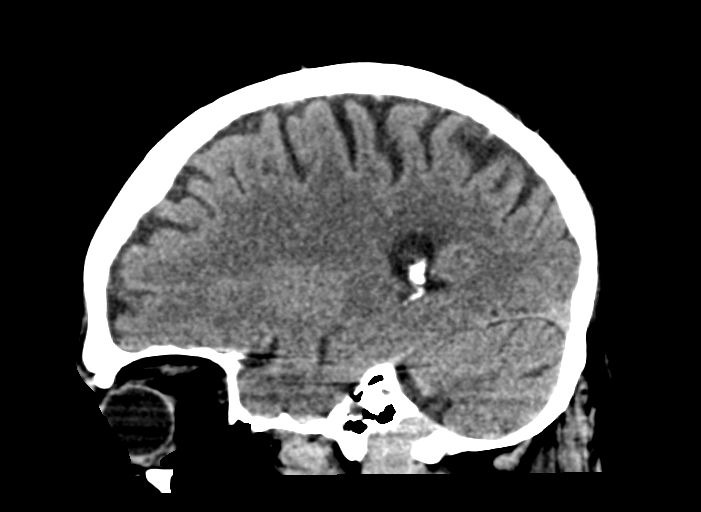

[15 of 47 positions shown; findings below may reference images not displayed]

FINDINGS: Brain: There is no mass, hemorrhage or extra-axial collection. The
size and configuration of the ventricles and extra-axial CSF spaces
are normal. There is no acute or chronic infarction. The brain
parenchyma is normal.

Vascular: No abnormal hyperdensity of the major intracranial
arteries or dural venous sinuses. No intracranial atherosclerosis.

Skull: The visualized skull base, calvarium and extracranial soft
tissues are normal.

Sinuses/Orbits: No fluid levels or advanced mucosal thickening of
the visualized paranasal sinuses. No mastoid or middle ear effusion.
The orbits are normal.
IMPRESSION: Normal aging brain.

## 2021-06-30 ENCOUNTER — Encounter (HOSPITAL_COMMUNITY): Payer: Self-pay

## 2021-06-30 ENCOUNTER — Other Ambulatory Visit: Payer: Self-pay

## 2021-06-30 ENCOUNTER — Emergency Department (HOSPITAL_COMMUNITY)
Admission: EM | Admit: 2021-06-30 | Discharge: 2021-07-01 | Disposition: A | Discharge status: Home / Self Care | Payer: Medicare Other | Attending: Emergency Medicine | Admitting: Emergency Medicine

## 2021-06-30 DIAGNOSIS — C799 Secondary malignant neoplasm of unspecified site: Secondary | ICD-10-CM | POA: Diagnosis not present

## 2021-06-30 DIAGNOSIS — N179 Acute kidney failure, unspecified: Secondary | ICD-10-CM | POA: Insufficient documentation

## 2021-06-30 DIAGNOSIS — E860 Dehydration: Secondary | ICD-10-CM | POA: Diagnosis not present

## 2021-06-30 DIAGNOSIS — Z7982 Long term (current) use of aspirin: Secondary | ICD-10-CM | POA: Insufficient documentation

## 2021-06-30 DIAGNOSIS — R100 Acute abdomen: Secondary | ICD-10-CM | POA: Insufficient documentation

## 2021-06-30 DIAGNOSIS — R531 Weakness: Secondary | ICD-10-CM | POA: Diagnosis present

## 2021-06-30 DIAGNOSIS — R0602 Shortness of breath: Secondary | ICD-10-CM | POA: Insufficient documentation

## 2021-06-30 LAB — CBC WITH DIFFERENTIAL/PLATELET
Abs Immature Granulocytes: 0.06 10*3/uL (ref 0.00–0.07)
Basophils Absolute: 0 10*3/uL (ref 0.0–0.1)
Basophils Relative: 0 %
Eosinophils Absolute: 0.1 10*3/uL (ref 0.0–0.5)
Eosinophils Relative: 1 %
HCT: 41.4 % (ref 39.0–52.0)
Hemoglobin: 14.1 g/dL (ref 13.0–17.0)
Immature Granulocytes: 1 %
Lymphocytes Relative: 17 %
Lymphs Abs: 1.7 10*3/uL (ref 0.7–4.0)
MCH: 31.3 pg (ref 26.0–34.0)
MCHC: 34.1 g/dL (ref 30.0–36.0)
MCV: 91.8 fL (ref 80.0–100.0)
Monocytes Absolute: 0.7 10*3/uL (ref 0.1–1.0)
Monocytes Relative: 7 %
Neutro Abs: 7.5 10*3/uL (ref 1.7–7.7)
Neutrophils Relative %: 74 %
Platelets: 279 10*3/uL (ref 150–400)
RBC: 4.51 MIL/uL (ref 4.22–5.81)
RDW: 13.2 % (ref 11.5–15.5)
WBC: 10.1 10*3/uL (ref 4.0–10.5)
nRBC: 0 % (ref 0.0–0.2)

## 2021-06-30 NOTE — ED Triage Notes (Signed)
Pt arrived from home via POV w c/o weakness, dizziness, multiple syncopal episodes, SOB, noted weight loss x 26lbs x 1 month, recent dx of masses in liver via [place] needs f/u CT. N/D with out emesis. Back pain 10/10.

## 2021-07-01 ENCOUNTER — Emergency Department (HOSPITAL_COMMUNITY): Payer: Medicare Other

## 2021-07-01 DIAGNOSIS — E860 Dehydration: Secondary | ICD-10-CM | POA: Diagnosis not present

## 2021-07-01 LAB — COMPREHENSIVE METABOLIC PANEL
ALT: 41 U/L (ref 0–44)
AST: 59 U/L — ABNORMAL HIGH (ref 15–41)
Albumin: 4.6 g/dL (ref 3.5–5.0)
Alkaline Phosphatase: 152 U/L — ABNORMAL HIGH (ref 38–126)
Anion gap: 10 (ref 5–15)
BUN: 54 mg/dL — ABNORMAL HIGH (ref 8–23)
CO2: 21 mmol/L — ABNORMAL LOW (ref 22–32)
Calcium: 9.9 mg/dL (ref 8.9–10.3)
Chloride: 100 mmol/L (ref 98–111)
Creatinine, Ser: 2.03 mg/dL — ABNORMAL HIGH (ref 0.61–1.24)
GFR, Estimated: 34 mL/min — ABNORMAL LOW (ref >60)
Glucose, Bld: 104 mg/dL — ABNORMAL HIGH (ref 70–99)
Potassium: 4.9 mmol/L (ref 3.5–5.1)
Sodium: 131 mmol/L — ABNORMAL LOW (ref 135–145)
Total Bilirubin: 0.6 mg/dL (ref 0.3–1.2)
Total Protein: 7.9 g/dL (ref 6.5–8.1)

## 2021-07-01 LAB — URINALYSIS, ROUTINE W REFLEX MICROSCOPIC
Bacteria, UA: NONE SEEN
Bilirubin Urine: NEGATIVE
Glucose, UA: >=500 mg/dL — AB
Hgb urine dipstick: NEGATIVE
Ketones, ur: NEGATIVE mg/dL
Nitrite: NEGATIVE
Protein, ur: 30 mg/dL — AB
Specific Gravity, Urine: 1.018 (ref 1.005–1.030)
pH: 5 (ref 5.0–8.0)

## 2021-07-01 LAB — TROPONIN I (HIGH SENSITIVITY)
Troponin I (High Sensitivity): 6 ng/L (ref <18)
Troponin I (High Sensitivity): 5 ng/L (ref <18)

## 2021-07-01 LAB — LIPASE, BLOOD: Lipase: 85 U/L — ABNORMAL HIGH (ref 11–51)

## 2021-07-01 LAB — LACTIC ACID, PLASMA: Lactic Acid, Venous: 2 mmol/L (ref 0.5–1.9)

## 2021-07-01 MED ORDER — MORPHINE SULFATE (PF) 4 MG/ML IV SOLN
4.0000 mg | Freq: Once | INTRAVENOUS | Status: AC
  Administered 2021-07-01: 4 mg via INTRAVENOUS
  Filled 2021-07-01: qty 1

## 2021-07-01 MED ORDER — ONDANSETRON HCL 4 MG/2ML IJ SOLN
4.0000 mg | Freq: Once | INTRAMUSCULAR | Status: AC
  Administered 2021-07-01: 4 mg via INTRAVENOUS
  Filled 2021-07-01: qty 2

## 2021-07-01 MED ORDER — ONDANSETRON 4 MG PO TBDP: ORAL_TABLET | ORAL | 0 refills | Status: DC

## 2021-07-01 MED ORDER — SODIUM CHLORIDE 0.9 % IV BOLUS
1000.0000 mL | Freq: Once | INTRAVENOUS | Status: AC
Start: 2021-07-01 — End: 2021-07-01
  Administered 2021-07-01: 1000 mL via INTRAVENOUS

## 2021-07-01 MED ORDER — OXYCODONE-ACETAMINOPHEN 5-325 MG PO TABS: 1.0000 | ORAL_TABLET | ORAL | 0 refills | Status: DC | PRN

## 2021-07-01 MED ORDER — SODIUM CHLORIDE 0.9 % IV BOLUS
1000.0000 mL | Freq: Once | INTRAVENOUS | Status: AC
  Administered 2021-07-01: 1000 mL via INTRAVENOUS

## 2021-07-01 NOTE — ED Provider Notes (Signed)
[name] EMERGENCY DEPARTMENT Provider Note   CSN: [id number] Arrival date & time: 06/30/21  2239     History  Chief Complaint  Patient presents with   Weakness    Kevin Summers is a 76 y.o. male.  Patient presents to the emergency department for evaluation of generalized weakness with falls.  Patient has been experiencing back and flank pain that has been keeping him up at night.  He has had several syncopal episodes.  Patient has been continuing to take his blood pressure medication but has been having episodes of hypotension.  Patient reports nausea, poor oral intake and watery diarrhea.  He recently had ultrasound follow-up at his vascular surgeons office for his prior AAA repair and there were incidental masses noted in his liver.  He was scheduled to have an outpatient CAT scan but has felt too ill to go.      Home Medications Prior to Admission medications   Medication Sig Start Date End Date Taking? Authorizing Provider  ondansetron (ZOFRAN-ODT) 4 MG disintegrating tablet '4mg'$  ODT q4 hours prn nausea/vomit 07/01/21  Yes Haylen Shelnutt, Gwenyth Allegra, MD  oxyCODONE-acetaminophen (PERCOCET) 5-325 MG tablet Take 1-2 tablets by mouth every 4 (four) hours as needed. 07/01/21  Yes Avrum Kimball, Gwenyth Allegra, MD  alfuzosin (UROXATRAL) 10 MG 24 hr tablet Take 10 mg by mouth daily with breakfast.  11/07/17   [provider]  aspirin EC 81 MG tablet Take 81 mg by mouth daily.    [provider]  atorvastatin (LIPITOR) 40 MG tablet Take 40 mg by mouth daily at 8 pm. 09/08/17   [provider]  Cholecalciferol (VITAMIN D) 50 MCG (2000 UT) tablet Take 2,000 Units by mouth daily.    [provider]  clopidogrel (PLAVIX) 75 MG tablet Take 75 mg by mouth daily.    [provider]  ezetimibe (ZETIA) 10 MG tablet Take 10 mg by mouth daily. 11/13/17   [provider]  ferrous sulfate 325 (65 FE) MG EC tablet Take 2 tablets by mouth 2 (two) times daily.     [provider]  glipiZIDE (GLUCOTROL) 5 MG tablet Take 5 mg by mouth daily. 09/08/17   [provider]  irbesartan-hydrochlorothiazide (AVALIDE) 300-12.5 MG tablet Take 1 tablet by mouth daily. 10/03/17   [provider]  JANUVIA 100 MG tablet Take 100 mg by mouth at bedtime. 10/21/17   [provider]  JARDIANCE 10 MG TABS tablet Take 10 mg by mouth daily. 10/06/17   [provider]  metFORMIN (GLUCOPHAGE) 1000 MG tablet Take 1,000 mg by mouth 2 (two) times daily with a meal.    [provider]  metoprolol tartrate (LOPRESSOR) 25 MG tablet Take 12.5 mg by mouth 2 (two) times daily.     [provider]  NIFEdipine (ADALAT CC) 30 MG 24 hr tablet Take 30 mg by mouth daily.  08/26/17   [provider]  omeprazole (PRILOSEC) 20 MG capsule Take 20 mg by mouth daily.  09/08/17   [provider]  pioglitazone (ACTOS) 15 MG tablet Take 15 mg by mouth daily.  10/03/17   [provider]      Allergies    Patient has no known allergies.    Review of Systems   Review of Systems  Physical Exam Updated Vital Signs BP (!) 141/71   Pulse 64   Temp (!) 97.5 F (36.4 C) (Oral)   Resp 16   Ht '5\' 9"'$  (1.753 m)   Wt  87.5 kg   SpO2 98%   BMI 28.50 kg/m  Physical Exam Vitals and nursing note reviewed.  Constitutional:      General: He is not in acute distress.    Appearance: He is well-developed.  HENT:     Head: Normocephalic and atraumatic.     Mouth/Throat:     Mouth: Mucous membranes are moist.  Eyes:     General: Vision grossly intact. Gaze aligned appropriately.     Extraocular Movements: Extraocular movements intact.     Conjunctiva/sclera: Conjunctivae normal.  Cardiovascular:     Rate and Rhythm: Normal rate and regular rhythm.     Pulses: Normal pulses.     Heart sounds: Normal heart sounds, S1 normal and S2 normal. No murmur heard.   No friction rub. No gallop.  Pulmonary:     Effort: Pulmonary  effort is normal. No respiratory distress.     Breath sounds: Normal breath sounds.  Abdominal:     Palpations: Abdomen is soft.     Tenderness: There is no abdominal tenderness. There is no guarding or rebound.     Hernia: No hernia is present.  Musculoskeletal:        General: No swelling.     Cervical back: Full passive range of motion without pain, normal range of motion and neck supple. No pain with movement, spinous process tenderness or muscular tenderness. Normal range of motion.     Right lower leg: No edema.     Left lower leg: No edema.  Skin:    General: Skin is warm and dry.     Capillary Refill: Capillary refill takes less than 2 seconds.     Findings: No ecchymosis, erythema, lesion or wound.  Neurological:     Mental Status: He is alert and oriented to person, place, and time.     GCS: GCS eye subscore is 4. GCS verbal subscore is 5. GCS motor subscore is 6.     Cranial Nerves: Cranial nerves 2-12 are intact.     Sensory: Sensation is intact.     Motor: Motor function is intact. No weakness or abnormal muscle tone.     Coordination: Coordination is intact.  Psychiatric:        Mood and Affect: Mood normal.        Speech: Speech normal.        Behavior: Behavior normal.    ED Results / Procedures / Treatments   Labs (all labs ordered are listed, but only abnormal results are displayed) Labs Reviewed  COMPREHENSIVE METABOLIC PANEL - Abnormal; Notable for the following components:      Result Value   Sodium 131 (*)    CO2 21 (*)    Glucose, Bld 104 (*)    BUN 54 (*)    Creatinine, Ser 2.03 (*)    AST 59 (*)    Alkaline Phosphatase 152 (*)    GFR, Estimated 34 (*)    All other components within normal limits  LIPASE, BLOOD - Abnormal; Notable for the following components:   Lipase 85 (*)    All other components within normal limits  LACTIC ACID, PLASMA - Abnormal; Notable for the following components:   Lactic Acid, Venous 2.0 (*)    All other components  within normal limits  URINALYSIS, ROUTINE W REFLEX MICROSCOPIC - Abnormal; Notable for the following components:   Color, Urine AMBER (*)    APPearance HAZY (*)    Glucose, UA >=500 (*)  Protein, ur 30 (*)    Leukocytes,Ua MODERATE (*)    All other components within normal limits  CBC WITH DIFFERENTIAL/PLATELET  TROPONIN I (HIGH SENSITIVITY)  TROPONIN I (HIGH SENSITIVITY)    EKG EKG Interpretation  Date/Time:  Friday Jun 30 2021 22:45:52 EDT Ventricular Rate:  72 PR Interval:  150 QRS Duration: 85 QT Interval:  343 QTC Calculation: 376 R Axis:   28 Text Interpretation: Sinus rhythm Normal ECG Confirmed by Orpah Greek [phone]) on 06/30/2021 11:13:37 PM  Radiology CT ABDOMEN PELVIS WO CONTRAST  Result Date: 07/01/2021 CLINICAL DATA:  Acute and nonlocalized abdominal pain EXAM: CT ABDOMEN AND PELVIS WITHOUT CONTRAST TECHNIQUE: Multidetector CT imaging of the abdomen and pelvis was performed following the standard protocol without IV contrast. RADIATION DOSE REDUCTION: This exam was performed according to the departmental dose-optimization program which includes automated exposure control, adjustment of the mA and/or kV according to patient size and/or use of iterative reconstruction technique. COMPARISON:  None Available. FINDINGS: Lower chest: Calcified nodule in the left lower lobe attributed to remote granulomatous disease. Coronary atherosclerosis. Hepatobiliary: Innumerable ill-defined low-density masses within the liverno evidence of biliary obstruction or stone. Pancreas: Unremarkable. Spleen: Granulomatous calcifications. Adrenals/Urinary Tract: Bilateral adrenal masses measuring up to 5 cm on the left. No hydronephrosis or stone. Unremarkable bladder. Stomach/Bowel:  No obstruction. No appendicitis. Vascular/Lymphatic: No acute vascular abnormality. Advanced atherosclerosis. Abdominal aortic aneurysm with stent grafting. 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 No mass or adenopathy. Reproductive:No pathologic findings. Other: No ascites or pneumoperitoneum. Musculoskeletal: Lytic lesions at the right puboacetabular junction and in the left iliac bone, up to 3.4 cm in the lateral left iliac wing. Lytic areas within the spine as well, specifically at T12 and L4. No acute fracture. IMPRESSION: Metastatic pattern with innumerable liver masses, bilateral adrenal masses, and multiple bony deposits. No visible primary mass, recommend metastatic workup. Electronically Signed   By: Jorje Guild M.D.   On: 07/01/2021 05:02    Procedures Procedures    Medications Ordered in ED Medications  sodium chloride 0.9 % bolus 1,000 mL (0 mLs Intravenous Stopped 07/01/21 0357)  sodium chloride 0.9 % bolus 1,000 mL (1,000 mLs Intravenous New Bag/Given 07/01/21 0537)  morphine (PF) 4 MG/ML injection 4 mg (4 mg Intravenous Given 07/01/21 3810)  ondansetron (ZOFRAN) injection 4 mg (4 mg Intravenous Given 07/01/21 0608)    ED Course/ Medical Decision Making/ A&P                           Medical Decision Making Problems Addressed: AKI (acute kidney injury) [facility]): acute illness or injury Dehydration: acute illness or injury  Amount and/or Complexity of Data Reviewed External Data Reviewed: notes. Labs: ordered. Decision-making details documented in ED Course. Radiology: ordered and independent interpretation performed. Decision-making details documented in ED Course. ECG/medicine tests: ordered and independent interpretation performed. Decision-making details documented in ED Course.  Risk Prescription drug management.   Patient presents to the emergency department for evaluation of multiple problems.  Patient is here primarily for generalized weakness, multiple syncopal episodes and back pain.  He has had falls.  Patient was noted to have masses in his liver as an incidental finding on a vascular ultrasound recently.  He has  not been able to follow-up for his CT scan.  Differential diagnosis is broad.  Stroke, TIA, hypotension, cardiac syncope, anemia, occult cancer considered among other diagnoses.  Lab work returns with  elevated BUN and creatinine consistent with dehydration.  Patient hydrated here in the department.  Blood pressures have been stable.  Because of his decreased GFR, CT scan of the abdomen and pelvis was performed without contrast.  This is concerning for diffuse metastatic disease in the bony structures as well as liver and adrenals.  No obvious primary identified.  These findings discussed with the patient and his daughter.  I did give the option for admission for further work-up and hydration but he would prefer to go home, reports that he would be more comfortable.  Patient therefore administered additional IV fluids and sent back to radiology for CT head and chest to further evaluate for a primary cancer.  Patient will be discharged with analgesia.  As no clear primary has been identified, will refer to oncology for further work-up.        Final Clinical Impression(s) / ED Diagnoses Final diagnoses:  Dehydration  AKI (acute kidney injury) ([place])  Metastatic malignant neoplasm, unspecified site [facility])    Rx / DC Orders ED Discharge Orders          Ordered    oxyCODONE-acetaminophen (PERCOCET) 5-325 MG tablet  Every 4 hours PRN        07/01/21 0553    Ambulatory referral to Hematology / Oncology        07/01/21 0553    ondansetron (ZOFRAN-ODT) 4 MG disintegrating tablet        07/01/21 0553              Orpah Greek, MD 07/01/21 [phone]

## 2021-07-01 NOTE — ED Notes (Signed)
Date and time results received: 07/01/21 0100 (use smartphrase ".now" to insert current time)  Test: Lactic Acid Critical Value: 2.0  Name of Provider Notified: Frederich Cha, MD

## 2021-07-12 ENCOUNTER — Inpatient Hospital Stay (HOSPITAL_COMMUNITY): Payer: Medicare Other

## 2021-07-12 ENCOUNTER — Other Ambulatory Visit (HOSPITAL_COMMUNITY): Payer: Self-pay | Admitting: Hematology

## 2021-07-12 ENCOUNTER — Encounter (HOSPITAL_COMMUNITY): Payer: Self-pay | Admitting: Hematology

## 2021-07-12 ENCOUNTER — Inpatient Hospital Stay (HOSPITAL_COMMUNITY): Payer: Medicare Other | Attending: Hematology | Admitting: Hematology

## 2021-07-12 ENCOUNTER — Other Ambulatory Visit (HOSPITAL_COMMUNITY): Payer: Self-pay

## 2021-07-12 VITALS — BP 106/72 | HR 89 | Resp 18 | Ht 70.0 in | Wt 188.9 lb

## 2021-07-12 DIAGNOSIS — R948 Abnormal results of function studies of other organs and systems: Secondary | ICD-10-CM

## 2021-07-12 DIAGNOSIS — R778 Other specified abnormalities of plasma proteins: Secondary | ICD-10-CM | POA: Diagnosis not present

## 2021-07-12 DIAGNOSIS — R937 Abnormal findings on diagnostic imaging of other parts of musculoskeletal system: Secondary | ICD-10-CM

## 2021-07-12 DIAGNOSIS — C787 Secondary malignant neoplasm of liver and intrahepatic bile duct: Secondary | ICD-10-CM | POA: Insufficient documentation

## 2021-07-12 DIAGNOSIS — E278 Other specified disorders of adrenal gland: Secondary | ICD-10-CM

## 2021-07-12 DIAGNOSIS — Z79899 Other long term (current) drug therapy: Secondary | ICD-10-CM | POA: Diagnosis not present

## 2021-07-12 DIAGNOSIS — R634 Abnormal weight loss: Secondary | ICD-10-CM | POA: Diagnosis not present

## 2021-07-12 DIAGNOSIS — I10 Essential (primary) hypertension: Secondary | ICD-10-CM | POA: Diagnosis not present

## 2021-07-12 DIAGNOSIS — R110 Nausea: Secondary | ICD-10-CM | POA: Diagnosis not present

## 2021-07-12 DIAGNOSIS — J9859 Other diseases of mediastinum, not elsewhere classified: Secondary | ICD-10-CM | POA: Insufficient documentation

## 2021-07-12 DIAGNOSIS — C7972 Secondary malignant neoplasm of left adrenal gland: Secondary | ICD-10-CM | POA: Insufficient documentation

## 2021-07-12 DIAGNOSIS — Z8052 Family history of malignant neoplasm of bladder: Secondary | ICD-10-CM | POA: Insufficient documentation

## 2021-07-12 DIAGNOSIS — C7971 Secondary malignant neoplasm of right adrenal gland: Secondary | ICD-10-CM | POA: Diagnosis not present

## 2021-07-12 DIAGNOSIS — M545 Low back pain, unspecified: Secondary | ICD-10-CM | POA: Diagnosis not present

## 2021-07-12 DIAGNOSIS — C7951 Secondary malignant neoplasm of bone: Secondary | ICD-10-CM | POA: Diagnosis present

## 2021-07-12 DIAGNOSIS — G893 Neoplasm related pain (acute) (chronic): Secondary | ICD-10-CM

## 2021-07-12 DIAGNOSIS — C801 Malignant (primary) neoplasm, unspecified: Secondary | ICD-10-CM | POA: Insufficient documentation

## 2021-07-12 DIAGNOSIS — K769 Liver disease, unspecified: Secondary | ICD-10-CM

## 2021-07-12 DIAGNOSIS — Z800 Family history of malignant neoplasm of digestive organs: Secondary | ICD-10-CM | POA: Insufficient documentation

## 2021-07-12 DIAGNOSIS — Z7952 Long term (current) use of systemic steroids: Secondary | ICD-10-CM | POA: Insufficient documentation

## 2021-07-12 DIAGNOSIS — E119 Type 2 diabetes mellitus without complications: Secondary | ICD-10-CM | POA: Insufficient documentation

## 2021-07-12 DIAGNOSIS — Z87891 Personal history of nicotine dependence: Secondary | ICD-10-CM | POA: Insufficient documentation

## 2021-07-12 DIAGNOSIS — C7970 Secondary malignant neoplasm of unspecified adrenal gland: Secondary | ICD-10-CM | POA: Diagnosis present

## 2021-07-12 LAB — CBC WITH DIFFERENTIAL/PLATELET
Abs Immature Granulocytes: 0.02 10*3/uL (ref 0.00–0.07)
Basophils Absolute: 0.1 10*3/uL (ref 0.0–0.1)
Basophils Relative: 1 %
Eosinophils Absolute: 0.1 10*3/uL (ref 0.0–0.5)
Eosinophils Relative: 1 %
HCT: 42.1 % (ref 39.0–52.0)
Hemoglobin: 14.1 g/dL (ref 13.0–17.0)
Immature Granulocytes: 0 %
Lymphocytes Relative: 21 %
Lymphs Abs: 1.5 10*3/uL (ref 0.7–4.0)
MCH: 31.1 pg (ref 26.0–34.0)
MCHC: 33.5 g/dL (ref 30.0–36.0)
MCV: 92.7 fL (ref 80.0–100.0)
Monocytes Absolute: 0.6 10*3/uL (ref 0.1–1.0)
Monocytes Relative: 8 %
Neutro Abs: 5.1 10*3/uL (ref 1.7–7.7)
Neutrophils Relative %: 69 %
Platelets: 264 10*3/uL (ref 150–400)
RBC: 4.54 MIL/uL (ref 4.22–5.81)
RDW: 13.4 % (ref 11.5–15.5)
WBC: 7.3 10*3/uL (ref 4.0–10.5)
nRBC: 0 % (ref 0.0–0.2)

## 2021-07-12 LAB — COMPREHENSIVE METABOLIC PANEL
ALT: 40 U/L (ref 0–44)
AST: 56 U/L — ABNORMAL HIGH (ref 15–41)
Albumin: 4.6 g/dL (ref 3.5–5.0)
Alkaline Phosphatase: 182 U/L — ABNORMAL HIGH (ref 38–126)
Anion gap: 11 (ref 5–15)
BUN: 37 mg/dL — ABNORMAL HIGH (ref 8–23)
CO2: 23 mmol/L (ref 22–32)
Calcium: 10.4 mg/dL — ABNORMAL HIGH (ref 8.9–10.3)
Chloride: 95 mmol/L — ABNORMAL LOW (ref 98–111)
Creatinine, Ser: 1.35 mg/dL — ABNORMAL HIGH (ref 0.61–1.24)
GFR, Estimated: 55 mL/min — ABNORMAL LOW (ref >60)
Glucose, Bld: 143 mg/dL — ABNORMAL HIGH (ref 70–99)
Potassium: 4.5 mmol/L (ref 3.5–5.1)
Sodium: 129 mmol/L — ABNORMAL LOW (ref 135–145)
Total Bilirubin: 1 mg/dL (ref 0.3–1.2)
Total Protein: 8.3 g/dL — ABNORMAL HIGH (ref 6.5–8.1)

## 2021-07-12 LAB — PSA: Prostatic Specific Antigen: 1.13 ng/mL (ref 0.00–4.00)

## 2021-07-12 LAB — LACTATE DEHYDROGENASE: LDH: 124 U/L (ref 98–192)

## 2021-07-12 MED ORDER — OXYCODONE-ACETAMINOPHEN 5-325 MG PO TABS: 1.0000 | ORAL_TABLET | ORAL | 0 refills | Status: DC | PRN

## 2021-07-12 MED ORDER — PROCHLORPERAZINE MALEATE 10 MG PO TABS: 10.0000 mg | ORAL_TABLET | Freq: Four times a day (QID) | ORAL | 1 refills | Status: AC | PRN

## 2021-07-12 MED ORDER — ONDANSETRON HCL 8 MG PO TABS: 8.0000 mg | ORAL_TABLET | Freq: Three times a day (TID) | ORAL | 1 refills | Status: AC | PRN

## 2021-07-12 NOTE — Progress Notes (Unsigned)
Sandi Mariscal, MD  Donita Brooks D [place] for US guided liver lesion Bx.   CT AP - 07/01/21 - reference image 29, series 2, of multiple liver lesions.   Cathren Harsh

## 2021-07-12 NOTE — Patient Instructions (Addendum)
[place] at [facility] Discharge Instructions  You were seen and examined today by Dr. Delton Coombes. Dr. Delton Coombes is a medical oncologist, meaning that he specializes in the treatment of cancer diagnoses. Dr. Delton Coombes discussed your past medical history, family history of cancers, and the events that led to you being here today.  You were referred to Dr. Delton Coombes due to an abnormal CT scan. There were concerning findings in the center of your chest, liver, bones and adrenal glands (small, hormone releasing glands that sit on top of your kidneys). It is concerning for cancer.  Dr. Delton Coombes has recommended a biopsy of the liver lesion to see if it is cancer, and where it started if it is cancerous.  Dr. Delton Coombes would also like to have labs drawn today. He has ordered several tumor markers that can help to indicate where the possible cancer may have arisen from. Tumor markers are not diagnostic but can be followed throughout to course of treatment. At this time, they do not replace the biopsy.  Dr. Delton Coombes has also recommended a PET scan. A PET scan a specialized, whole body CT scan that illuminates where there is cancer present within the body. This accurately identifies where the cancer is impacting your body and the stage of your cancer.  MEDICATIONS: Stop Plaquenil, Lipitor, Avalide and Metoprolol.   Please start checking your blood sugar each morning - depending on what your sugar is, changes may be made to your diabetic medications.  Please follow-up with Dr. Delton Coombes approximately 1 week after the biopsy.   Thank you for choosing [place] at [facility] to provide your oncology and hematology care.  To afford each patient quality time with our provider, please arrive at least 15 minutes before your scheduled appointment time.   If you have a lab appointment with the [place] please come in thru the Main Entrance and  check in at the main information desk.  You need to re-schedule your appointment should you arrive 10 or more minutes late.  We strive to give you quality time with our providers, and arriving late affects you and other patients whose appointments are after yours.  Also, if you no show three or more times for appointments you may be dismissed from the clinic at the providers discretion.     Again, thank you for choosing [facility].  Our hope is that these requests will decrease the amount of time that you wait before being seen by our physicians.       _____________________________________________________________  Should you have questions after your visit to [facility], please contact our office at [phone] and follow the prompts.  Our office hours are 8:00 a.m. and 4:30 p.m. Monday - Friday.  Please note that voicemails left after 4:00 p.m. may not be returned until the following business day.  We are closed weekends and major holidays.  You do have access to a nurse 24-7, just call the main number to the clinic [phone] and do not press any options, hold on the line and a nurse will answer the phone.    For prescription refill requests, have your pharmacy contact our office and allow 72 hours.    Due to Covid, you will need to wear a mask upon entering the hospital. If you do not have a mask, a mask will be given to you at the Main Entrance upon arrival. For doctor visits, patients may have  1 support person age 75 or older with them. For treatment visits, patients can not have anyone with them due to social distancing guidelines and our immunocompromised population.

## 2021-07-12 NOTE — Progress Notes (Signed)
[name] [address], [place] [address]   CLINIC:  Medical Oncology/Hematology  CONSULT NOTE  Patient Care Team: Pomposini, Cherly Anderson, MD as PCP - General (Internal Medicine) Derek Jack, MD as Medical Oncologist (Medical Oncology) Brien Mates, RN as Oncology Nurse Navigator (Medical Oncology)  CHIEF COMPLAINTS/PURPOSE OF CONSULTATION:  Evaluation for diffuse metastatic lesions in the liver, bones  HISTORY OF PRESENTING ILLNESS:  [name] 76 y.o. male is here because of evaluation for diffuse metastatic disease, at the request of ED.  Today he reports feeling fair, and he is accompanied by his daughter. He reports lower back pain which radiates to his chest and groin and started 1 month ago; he currently takes 1 oxycodone tablet every 4 hours prn, and he typically takes 2 tablets daily. He has lost 35 lbs in 2 months. His daughter reports he is not eating well. He reports occasional trouble swallowing solid foods. He reports his last colonoscopy was 4-5 years ago; he reports he had an endoscopy over 20 years ago. He denies personal history of cancer. He reports nocturia. He reports nausea for which he is taking Zofran prn which has not helped. He denies headaches. He reports recently his vision has worsened, and he now has to move objects closer to see them whereas he did not need to do so previously. He underwent partial thyroidectomy over 20 years ago. His daughter reports he has episodes of dizziness and syncope. He denies MI and CVA. He is not taking PLAQUENIL or Lipitor. He does not monitor his blood sugar at home.   He currently lives at home on his own, and his daughter lives nearby. 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 His father had bladder cancer; a paternal aunt, his paternal grandfather, and 2 paternal cousins had stomach cancer. He quit smoking in 2018 after smoking 1 ppd for 50 years.   MEDICAL HISTORY:  Past  Medical History:  Diagnosis Date   COPD (chronic obstructive pulmonary disease) ([place])    Diabetes mellitus without complication ([place])    Diabetes mellitus, type II ([place])    HOH (hard of hearing)    Hyperlipidemia    Hypertension    Sleep apnea     SURGICAL HISTORY: Past Surgical History:  Procedure Laterality Date   ABDOMINAL AORTIC ANEURYSM REPAIR     APPENDECTOMY     THYROIDECTOMY, PARTIAL      SOCIAL HISTORY: Social History   Socioeconomic History   Marital status: Married    Spouse name: Not on file   Number of children: Not on file   Years of education: Not on file   Highest education level: Not on file  Occupational History   Not on file  Tobacco Use   Smoking status: Former    Packs/day: 1.00    Years: 25.00    Pack years: 25.00    Types: Cigarettes    Quit date: 11/24/2016    Years since quitting: 4.6   Smokeless tobacco: Never  Vaping Use   Vaping Use: Never used  Substance and Sexual Activity   Alcohol use: Not Currently    Comment: "couple a day"   Drug use: Never   Sexual activity: Not on file  Other Topics Concern   Not on file  Social History Narrative   Not on file   Social Determinants of Health   Financial Resource Strain: Not on file  Food Insecurity: Not on file  Transportation Needs: Not  on file  Physical Activity: Not on file  Stress: Not on file  Social Connections: Not on file  Intimate Partner Violence: Not on file    FAMILY HISTORY: Family History  Problem Relation Age of Onset   Diabetes Mother    Hypertension Mother    Hyperlipidemia Mother    Diabetes Father    Thyroid disease Father     ALLERGIES:  is allergic to nifedipine.  MEDICATIONS:  Current Outpatient Medications  Medication Sig Dispense Refill   alfuzosin (UROXATRAL) 10 MG 24 hr tablet Take 10 mg by mouth daily with breakfast.      aspirin EC 81 MG tablet Take 81 mg by mouth daily.     atorvastatin (LIPITOR) 40 MG tablet Take 40 mg by mouth daily at 8  pm.     Cholecalciferol (VITAMIN D) 125 MCG (5000 UT) CAPS Take 5,000 Units by mouth daily.     clopidogrel (PLAVIX) 75 MG tablet Take 75 mg by mouth daily.     Dulaglutide (TRULICITY) 1.5 QX/4.5WT SOPN Trulicity 1.5 UU/8.2 mL subcutaneous pen injector  INJECT THE CONTENTS OF ONE  PEN SUBCUTANEOUSLY WEEKLY  AS DIRECTED     ezetimibe (ZETIA) 10 MG tablet Take 10 mg by mouth daily.  5   glipiZIDE (GLUCOTROL) 5 MG tablet Take 5 mg by mouth daily.     hydroxychloroquine (PLAQUENIL) 200 MG tablet Take 200 mg by mouth daily.     irbesartan-hydrochlorothiazide (AVALIDE) 300-12.5 MG tablet Take 1 tablet by mouth daily.     JARDIANCE 10 MG TABS tablet Take 10 mg by mouth daily.     metFORMIN (GLUCOPHAGE) 1000 MG tablet Take 1,000 mg by mouth 2 (two) times daily with a meal.     metoprolol tartrate (LOPRESSOR) 25 MG tablet Take 12.5 mg by mouth 2 (two) times daily.      omeprazole (PRILOSEC) 20 MG capsule Take 20 mg by mouth daily.      ondansetron (ZOFRAN-ODT) 4 MG disintegrating tablet '4mg'$  ODT q4 hours prn nausea/vomit 20 tablet 0   oxyCODONE-acetaminophen (PERCOCET) 5-325 MG tablet Take 1-2 tablets by mouth every 4 (four) hours as needed. 30 tablet 0   pioglitazone (ACTOS) 15 MG tablet Take 15 mg by mouth daily.      predniSONE (DELTASONE) 10 MG tablet Take 10 mg by mouth daily with breakfast.     No current facility-administered medications for this visit.    REVIEW OF SYSTEMS:   Review of Systems  Constitutional:  Positive for appetite change and fatigue.  HENT:   Positive for trouble swallowing.   Eyes:  Positive for eye problems.  Respiratory:  Positive for cough and shortness of breath.   Cardiovascular:  Positive for chest pain (7/10 R side) and palpitations.  Gastrointestinal:  Positive for abdominal pain (groin) and nausea.  Genitourinary:  Positive for nocturia.   Musculoskeletal:  Positive for arthralgias and back pain.  Neurological:  Positive for dizziness and numbness. Negative  for headaches.  Psychiatric/Behavioral:  Positive for sleep disturbance.   All other systems reviewed and are negative.   PHYSICAL EXAMINATION: ECOG PERFORMANCE STATUS: 1 - Symptomatic but completely ambulatory  Vitals:   07/12/21 0830  BP: 106/72  Pulse: 89  Resp: 18  SpO2: 95%   Filed Weights   07/12/21 0830  Weight: 188 lb 14.4 oz (85.7 kg)   Physical Exam Vitals reviewed.  Constitutional:      Appearance: Normal appearance.  Cardiovascular:     Rate and Rhythm: Normal rate  and regular rhythm.     Pulses: Normal pulses.     Heart sounds: Normal heart sounds.  Pulmonary:     Effort: Pulmonary effort is normal.     Breath sounds: Normal breath sounds.  Abdominal:     General: A surgical scar is present.     Palpations: Abdomen is soft. There is hepatomegaly (liver border palpable on deep inspiration). There is no splenomegaly or mass.     Tenderness: There is no abdominal tenderness.  Musculoskeletal:     Right lower leg: No edema.     Left lower leg: No edema.  Lymphadenopathy:     Cervical: No cervical adenopathy.     Right cervical: No superficial cervical adenopathy.    Left cervical: No superficial cervical adenopathy.     Upper Body:     Right upper body: No supraclavicular or axillary adenopathy.     Left upper body: No supraclavicular or axillary adenopathy.     Lower Body: No right inguinal adenopathy. No left inguinal adenopathy.  Neurological:     General: No focal deficit present.     Mental Status: He is alert and oriented to person, place, and time.  Psychiatric:        Mood and Affect: Mood normal.        Behavior: Behavior normal.     LABORATORY DATA:  I have reviewed the data as listed    Latest Ref Rng & Units 06/30/2021   10:57 PM 11/24/2017    3:39 AM  CBC  WBC 4.0 - 10.5 K/uL 10.1   4.4    Hemoglobin 13.0 - 17.0 g/dL 14.1   14.7    Hematocrit 39.0 - 52.0 % 41.4   43.0    Platelets 150 - 400 K/uL 279   177        Latest Ref Rng &  Units 06/30/2021   10:57 PM 11/24/2017    3:39 AM  CMP  Glucose 70 - 99 mg/dL 104   260    BUN 8 - 23 mg/dL 54   15    Creatinine 0.61 - 1.24 mg/dL 2.03   1.05    Sodium 135 - 145 mmol/L 131   133    Potassium 3.5 - 5.1 mmol/L 4.9   3.8    Chloride 98 - 111 mmol/L 100   98    CO2 22 - 32 mmol/L 21   23    Calcium 8.9 - 10.3 mg/dL 9.9   9.0    Total Protein 6.5 - 8.1 g/dL 7.9   7.2    Total Bilirubin 0.3 - 1.2 mg/dL 0.6   0.8    Alkaline Phos 38 - 126 U/L 152   47    AST 15 - 41 U/L 59   27    ALT 0 - 44 U/L 41   30      RADIOGRAPHIC STUDIES: I have personally reviewed the radiological images as listed and agreed with the findings in the report. CT ABDOMEN PELVIS WO CONTRAST  Result Date: 07/01/2021 CLINICAL DATA:  Acute and nonlocalized abdominal pain EXAM: CT ABDOMEN AND PELVIS WITHOUT CONTRAST TECHNIQUE: Multidetector CT imaging of the abdomen and pelvis was performed following the standard protocol without IV contrast. RADIATION DOSE REDUCTION: This exam was performed according to the departmental dose-optimization program which includes automated exposure control, adjustment of the mA and/or kV according to patient size and/or use of iterative reconstruction technique. COMPARISON:  None Available. FINDINGS: Lower chest: Calcified  nodule in the left lower lobe attributed to remote granulomatous disease. Coronary atherosclerosis. Hepatobiliary: Innumerable ill-defined low-density masses within the liverno evidence of biliary obstruction or stone. Pancreas: Unremarkable. Spleen: Granulomatous calcifications. Adrenals/Urinary Tract: Bilateral adrenal masses measuring up to 5 cm on the left. No hydronephrosis or stone. Unremarkable bladder. Stomach/Bowel:  No obstruction. No appendicitis. Vascular/Lymphatic: No acute vascular abnormality. Advanced atherosclerosis. Abdominal aortic aneurysm with stent grafting. 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 Bifemoral  bypass. No mass or adenopathy. Reproductive:No pathologic findings. Other: No ascites or pneumoperitoneum. Musculoskeletal: Lytic lesions at the right puboacetabular junction and in the left iliac bone, up to 3.4 cm in the lateral left iliac wing. Lytic areas within the spine as well, specifically at T12 and L4. No acute fracture. IMPRESSION: Metastatic pattern with innumerable liver masses, bilateral adrenal masses, and multiple bony deposits. No visible primary mass, recommend metastatic workup. Electronically Signed   By: Jorje Guild M.D.   On: 07/01/2021 05:02   CT HEAD WO CONTRAST (5MM)  Result Date: 07/01/2021 CLINICAL DATA:  Metastatic disease on abdomen/pelvis CT. EXAM: CT HEAD WITHOUT CONTRAST TECHNIQUE: Contiguous axial images were obtained from the base of the skull through the vertex without intravenous contrast. RADIATION DOSE REDUCTION: This exam was performed according to the departmental dose-optimization program which includes automated exposure control, adjustment of the mA and/or kV according to patient size and/or use of iterative reconstruction technique. COMPARISON:  11/24/2017 FINDINGS: Brain: There is no evidence for acute hemorrhage, hydrocephalus, mass lesion, or abnormal extra-axial fluid collection. No definite CT evidence for acute infarction. Patchy low attenuation in the deep hemispheric and periventricular white matter is nonspecific, but likely reflects chronic microvascular ischemic demyelination. Vascular: No hyperdense vessel or unexpected calcification. Skull: No evidence for fracture. No worrisome lytic or sclerotic lesion. Sinuses/Orbits: The visualized paranasal sinuses and mastoid air cells are clear. Visualized portions of the globes and intraorbital fat are unremarkable. Other: None. IMPRESSION: 1. No acute intracranial abnormality. No evidence for metastatic disease to the brain. 2. Chronic small vessel ischemic disease. Electronically Signed   By: Misty Stanley  M.D.   On: 07/01/2021 06:46   CT Chest Wo Contrast  Result Date: 07/01/2021 CLINICAL DATA:  Metastatic disease on abdomen/pelvis CT. Progressive weakness and dizziness. Shortness of breath. Weight loss. EXAM: CT CHEST WITHOUT CONTRAST TECHNIQUE: Multidetector CT imaging of the chest was performed following the standard protocol without IV contrast. RADIATION DOSE REDUCTION: This exam was performed according to the departmental dose-optimization program which includes automated exposure control, adjustment of the mA and/or kV according to patient size and/or use of iterative reconstruction technique. COMPARISON:  None Available. FINDINGS: Cardiovascular: The heart size is normal. No substantial pericardial effusion. Coronary artery calcification is evident. Moderate atherosclerotic calcification is noted in the wall of the thoracic aorta. Mediastinum/Nodes: Apparent right hemithyroidectomy. Heterogeneous enlargement of the left thyroid lobe evident. 6.1 x 5.2 x 4.1 cm soft tissue mass is identified in the right upper mediastinum. There is an adjacent 2.3 cm retrosternal anterior mediastinal nodule. Both lesions show heterogeneous enhancement in scattered dystrophic calcification. No mediastinal lymphadenopathy. There is no hilar lymphadenopathy. Calcified nodal tissue is seen in the left hilum. The esophagus has normal imaging features. There is no axillary lymphadenopathy. Lungs/Pleura: No suspicious pulmonary nodule or mass. Calcified granuloma noted left lower lobe. No focal airspace consolidation. No pleural effusion. Upper Abdomen: Multiple hypoattenuating liver lesions were better characterized on abdomen CT earlier today. Musculoskeletal: 1.3 cm lytic lesion identified in the right  aspect of the T1 vertebral body. Small lucent lesions noted left glenoid. IMPRESSION: 1. 6.1 x 5.2 x 4.1 cm soft tissue mass in the right upper mediastinum with an adjacent 2.3 cm retrosternal anterior mediastinal nodule.  Patient is status post right hemithyroidectomy with heterogeneous enlargement of left thyroid lobe. No direct communication of the mediastinal lesions to the left thyroid lobe evident. Residual thyroid nodule after thyroidectomy would be a consideration. Thyroid neoplasm or thymoma not excluded. PET-CT may prove helpful to further evaluate. 2. 1.3 cm lytic lesion in the right aspect of the T1 vertebral body, suspicious for metastatic disease. 3. Small lucent lesions noted in the left glenoid, also suspicious for metastatic disease. 4. Multiple hypoattenuating liver lesions were better characterized on abdomen CT earlier today. 5. Aortic Atherosclerosis (ICD10-I70.0). Electronically Signed   By: Misty Stanley M.D.   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  Lytic lesion at the right puboacetabular junction and in the left iliac bone, T12 and L4. - CT chest without contrast (07/01/2021): 6.1 x 5.2 x 4.1 cm soft tissue mass in the right upper mediastinum with an adjacent 2.3 cm retrosternal anterior mediastinal nodule.  Heterogeneous enlargement of the left thyroid lobe with history of right hemithyroidectomy.  1.3 cm lytic lesion in the right aspect of T1 vertebral body.  Small lucent lesions noted in the left glenoid.   Social/family history: - He is widowed for the past 18 months.  Daughter (Ginger) lives next door.  He is doing well with the Atrovent and will.  Quit smoking in 2018.  Smoked 1 pack/day for 50 years. - Father had bladder cancer.  1 paternal aunt and 2 paternal cousins had stomach cancer.  Paternal grandfather also had stomach cancer.   PLAN:  Diffuse  metastatic liver, adrenal and bone lesions: - I have reviewed images of the scans with the patient and his daughter. - Recommend PET CT scan and biopsy of the liver lesions. - We will check tumor markers including CEA, LDH, CA 19-9 and PSA. - RTC after the biopsy.  2.  Intractable nausea: - She is currently taking Zofran and ODT 1 tablet daily which is not helping. - We will change Zofran to 8 mg p.o. every 8 hours and also start him on Compazine 10 mg every 6 hours.  3.  Hypertension: - Metoprolol and irbesartan/HCTZ on hold.  4.  Low back pain: - He is currently taking Percocet 5/325 1 tablet every 12 hours.  Its not lasting but more than 4-6 hours.  I will switch his Percocet to every 6 hours as needed.  5.  Diabetes: - He is on metformin 1000 mg twice daily, pioglitazone 15 mg daily, Jardiance 10 mg daily and Trulicity weekly. - I have recommended checking his blood sugars and bring back the log to [place].   All questions were answered. The patient knows to call the clinic with any problems, questions or concerns.   Derek Jack, MD, 07/12/21 9:19 AM  [place] [phone]   I, Thana Ates, am acting as a scribe for Dr. Derek Jack.  Kinnie Scales MD, have reviewed the above  documentation for accuracy and completeness, and I agree with the above.

## 2021-07-13 ENCOUNTER — Encounter (HOSPITAL_COMMUNITY): Payer: Self-pay

## 2021-07-13 ENCOUNTER — Ambulatory Visit (HOSPITAL_COMMUNITY)
Admission: RE | Admit: 2021-07-13 | Discharge: 2021-07-13 | Disposition: A | Discharge status: Home / Self Care | Payer: Medicare Other | Source: Ambulatory Visit | Attending: Hematology | Admitting: Hematology

## 2021-07-13 DIAGNOSIS — E278 Other specified disorders of adrenal gland: Secondary | ICD-10-CM | POA: Insufficient documentation

## 2021-07-13 DIAGNOSIS — R937 Abnormal findings on diagnostic imaging of other parts of musculoskeletal system: Secondary | ICD-10-CM | POA: Diagnosis present

## 2021-07-13 DIAGNOSIS — R160 Hepatomegaly, not elsewhere classified: Secondary | ICD-10-CM | POA: Insufficient documentation

## 2021-07-13 DIAGNOSIS — K769 Liver disease, unspecified: Secondary | ICD-10-CM | POA: Diagnosis present

## 2021-07-13 LAB — CEA: CEA: 4.3 ng/mL (ref 0.0–4.7)

## 2021-07-13 LAB — CANCER ANTIGEN 19-9: CA 19-9: 41 U/mL — ABNORMAL HIGH (ref 0–35)

## 2021-07-13 MED ORDER — FLUDEOXYGLUCOSE F - 18 (FDG) INJECTION
9.5800 | Freq: Once | INTRAVENOUS | Status: AC | PRN
  Administered 2021-07-13: 9.58 via INTRAVENOUS

## 2021-07-13 NOTE — Progress Notes (Signed)
I met with the patient and his daughter during and following initial visit with Dr. Delton Coombes. I introduced myself and explained my role in the patient's care. I provided my contact information and encouraged the patient to call with questions or concerns.

## 2021-07-15 ENCOUNTER — Encounter (HOSPITAL_COMMUNITY): Payer: Self-pay

## 2021-07-17 ENCOUNTER — Other Ambulatory Visit (HOSPITAL_COMMUNITY): Payer: Self-pay | Admitting: *Deleted

## 2021-07-17 MED ORDER — OXYCODONE-ACETAMINOPHEN 5-325 MG PO TABS
1.0000 | ORAL_TABLET | ORAL | 0 refills | Status: DC | PRN
Start: 2021-07-17 — End: 2021-08-28

## 2021-07-18 ENCOUNTER — Other Ambulatory Visit (HOSPITAL_COMMUNITY): Payer: Self-pay | Admitting: Hematology

## 2021-07-18 ENCOUNTER — Encounter (HOSPITAL_COMMUNITY): Payer: Self-pay

## 2021-07-18 MED ORDER — TRAMADOL HCL 50 MG PO TABS: 50.0000 mg | ORAL_TABLET | Freq: Four times a day (QID) | ORAL | 0 refills | Status: AC | PRN

## 2021-08-03 ENCOUNTER — Other Ambulatory Visit: Payer: Self-pay | Admitting: Internal Medicine

## 2021-08-03 ENCOUNTER — Other Ambulatory Visit: Payer: Self-pay | Admitting: Radiology

## 2021-08-03 DIAGNOSIS — K769 Liver disease, unspecified: Secondary | ICD-10-CM

## 2021-08-03 NOTE — Progress Notes (Signed)
Reviewed instructions for appt in radiology tomorrow with pt daughter. Daughter verbalizes understanding. Last dose plavix 07/29/21

## 2021-08-04 ENCOUNTER — Encounter (HOSPITAL_COMMUNITY): Payer: Self-pay

## 2021-08-04 ENCOUNTER — Other Ambulatory Visit: Payer: Self-pay

## 2021-08-04 ENCOUNTER — Ambulatory Visit (HOSPITAL_COMMUNITY)
Admission: RE | Admit: 2021-08-04 | Discharge: 2021-08-04 | Disposition: A | Discharge status: Home / Self Care | Payer: Medicare Other | Source: Ambulatory Visit | Attending: Hematology | Admitting: Hematology

## 2021-08-04 DIAGNOSIS — K769 Liver disease, unspecified: Secondary | ICD-10-CM | POA: Diagnosis present

## 2021-08-04 DIAGNOSIS — J9859 Other diseases of mediastinum, not elsewhere classified: Secondary | ICD-10-CM | POA: Insufficient documentation

## 2021-08-04 DIAGNOSIS — E278 Other specified disorders of adrenal gland: Secondary | ICD-10-CM | POA: Diagnosis present

## 2021-08-04 DIAGNOSIS — E119 Type 2 diabetes mellitus without complications: Secondary | ICD-10-CM | POA: Insufficient documentation

## 2021-08-04 LAB — GLUCOSE, CAPILLARY: Glucose-Capillary: 95 mg/dL (ref 70–99)

## 2021-08-04 MED ORDER — SODIUM CHLORIDE 0.9 % IV SOLN: INTRAVENOUS | Status: DC

## 2021-08-04 NOTE — Progress Notes (Signed)
Before getting patient ready for liver biopsy, RN asked about aspirin and plavix. Pt stated he held his plavix but took his aspirin last pm. Precious Bard in Radiology informed, pt procedure cancelled and to be rescheduled.

## 2021-08-09 ENCOUNTER — Ambulatory Visit (HOSPITAL_COMMUNITY): Payer: Medicare Other | Admitting: Hematology

## 2021-08-15 ENCOUNTER — Emergency Department (HOSPITAL_COMMUNITY): Payer: Medicare Other

## 2021-08-15 ENCOUNTER — Emergency Department (HOSPITAL_COMMUNITY)
Admission: EM | Admit: 2021-08-15 | Discharge: 2021-08-15 | Disposition: A | Discharge status: Home / Self Care | Payer: Medicare Other | Attending: Emergency Medicine | Admitting: Emergency Medicine

## 2021-08-15 ENCOUNTER — Other Ambulatory Visit: Payer: Self-pay

## 2021-08-15 ENCOUNTER — Encounter (HOSPITAL_COMMUNITY): Payer: Self-pay | Admitting: Emergency Medicine

## 2021-08-15 DIAGNOSIS — C801 Malignant (primary) neoplasm, unspecified: Secondary | ICD-10-CM | POA: Diagnosis not present

## 2021-08-15 DIAGNOSIS — Z87891 Personal history of nicotine dependence: Secondary | ICD-10-CM | POA: Insufficient documentation

## 2021-08-15 DIAGNOSIS — R29898 Other symptoms and signs involving the musculoskeletal system: Secondary | ICD-10-CM | POA: Diagnosis not present

## 2021-08-15 DIAGNOSIS — C7989 Secondary malignant neoplasm of other specified sites: Secondary | ICD-10-CM | POA: Diagnosis not present

## 2021-08-15 DIAGNOSIS — R52 Pain, unspecified: Secondary | ICD-10-CM | POA: Diagnosis not present

## 2021-08-15 DIAGNOSIS — E119 Type 2 diabetes mellitus without complications: Secondary | ICD-10-CM | POA: Insufficient documentation

## 2021-08-15 DIAGNOSIS — I10 Essential (primary) hypertension: Secondary | ICD-10-CM | POA: Insufficient documentation

## 2021-08-15 DIAGNOSIS — J449 Chronic obstructive pulmonary disease, unspecified: Secondary | ICD-10-CM | POA: Insufficient documentation

## 2021-08-15 DIAGNOSIS — Z859 Personal history of malignant neoplasm, unspecified: Secondary | ICD-10-CM | POA: Insufficient documentation

## 2021-08-15 DIAGNOSIS — R0602 Shortness of breath: Secondary | ICD-10-CM | POA: Insufficient documentation

## 2021-08-15 DIAGNOSIS — C7951 Secondary malignant neoplasm of bone: Secondary | ICD-10-CM | POA: Insufficient documentation

## 2021-08-15 DIAGNOSIS — C787 Secondary malignant neoplasm of liver and intrahepatic bile duct: Secondary | ICD-10-CM | POA: Diagnosis not present

## 2021-08-15 HISTORY — DX: Malignant (primary) neoplasm, unspecified: C80.1

## 2021-08-15 LAB — CBC
HCT: 37.1 % — ABNORMAL LOW (ref 39.0–52.0)
Hemoglobin: 12.4 g/dL — ABNORMAL LOW (ref 13.0–17.0)
MCH: 31.3 pg (ref 26.0–34.0)
MCHC: 33.4 g/dL (ref 30.0–36.0)
MCV: 93.7 fL (ref 80.0–100.0)
Platelets: 191 10*3/uL (ref 150–400)
RBC: 3.96 MIL/uL — ABNORMAL LOW (ref 4.22–5.81)
RDW: 13.9 % (ref 11.5–15.5)
WBC: 5.2 10*3/uL (ref 4.0–10.5)
nRBC: 0 % (ref 0.0–0.2)

## 2021-08-15 LAB — COMPREHENSIVE METABOLIC PANEL
ALT: 31 U/L (ref 0–44)
AST: 56 U/L — ABNORMAL HIGH (ref 15–41)
Albumin: 3.8 g/dL (ref 3.5–5.0)
Alkaline Phosphatase: 229 U/L — ABNORMAL HIGH (ref 38–126)
Anion gap: 8 (ref 5–15)
BUN: 20 mg/dL (ref 8–23)
CO2: 25 mmol/L (ref 22–32)
Calcium: 12.3 mg/dL — ABNORMAL HIGH (ref 8.9–10.3)
Chloride: 100 mmol/L (ref 98–111)
Creatinine, Ser: 0.96 mg/dL (ref 0.61–1.24)
GFR, Estimated: >60 mL/min (ref >60)
Glucose, Bld: 149 mg/dL — ABNORMAL HIGH (ref 70–99)
Potassium: 4.4 mmol/L (ref 3.5–5.1)
Sodium: 133 mmol/L — ABNORMAL LOW (ref 135–145)
Total Bilirubin: 1.1 mg/dL (ref 0.3–1.2)
Total Protein: 7.6 g/dL (ref 6.5–8.1)

## 2021-08-15 LAB — TROPONIN I (HIGH SENSITIVITY)
Troponin I (High Sensitivity): 7 ng/L (ref <18)
Troponin I (High Sensitivity): 6 ng/L (ref <18)

## 2021-08-15 LAB — POC OCCULT BLOOD, ED: Fecal Occult Bld: NEGATIVE

## 2021-08-15 MED ORDER — HYDROMORPHONE HCL 1 MG/ML IJ SOLN
0.5000 mg | INTRAMUSCULAR | Status: DC | PRN
  Administered 2021-08-15: 0.5 mg via INTRAVENOUS
  Filled 2021-08-15: qty 0.5

## 2021-08-15 MED ORDER — SODIUM CHLORIDE 0.9 % IV BOLUS
1000.0000 mL | Freq: Once | INTRAVENOUS | Status: AC
  Administered 2021-08-15: 1000 mL via INTRAVENOUS

## 2021-08-15 MED ORDER — GADOBUTROL 1 MMOL/ML IV SOLN
10.0000 mL | Freq: Once | INTRAVENOUS | Status: AC | PRN
  Administered 2021-08-15: 10 mL via INTRAVENOUS

## 2021-08-15 MED ORDER — HYDROMORPHONE HCL 1 MG/ML IJ SOLN
0.5000 mg | Freq: Once | INTRAMUSCULAR | Status: AC
  Administered 2021-08-15: 0.5 mg via INTRAVENOUS
  Filled 2021-08-15: qty 0.5

## 2021-08-15 MED ORDER — IOHEXOL 350 MG/ML SOLN
100.0000 mL | Freq: Once | INTRAVENOUS | Status: AC | PRN
  Administered 2021-08-15: 100 mL via INTRAVENOUS

## 2021-08-15 MED ORDER — MEGESTROL ACETATE 400 MG/10ML PO SUSP: 800.0000 mg | Freq: Every day | ORAL | 0 refills | Status: AC

## 2021-08-15 MED ORDER — ONDANSETRON HCL 4 MG/2ML IJ SOLN: 4.0000 mg | Freq: Once | INTRAMUSCULAR | Status: DC | PRN

## 2021-08-15 NOTE — ED Provider Notes (Signed)
[facility] Emergency Department Provider Note MRN:  [id number]  Arrival date & time: 08/15/21     Chief Complaint   Shortness of Breath and Weakness   History of Present Illness   Kevin Summers is a 76 y.o. year-old male with a history of COPD, diabetes, metastatic cancer presenting to the ED with chief complaint of shortness of breath and weakness.  Recent unintentional weight loss, poor p.o. intake, CT scan found metastatic cancer back in May.  Unclear primary.  Today having continued weakness, malaise, very lightheaded and with dyspnea exertion with minimal ambulation.  Heart rate jumps up to the 130s with ambulating.  Endorsing some new chest pain over the past few days as well.  Review of Systems  A thorough review of systems was obtained and all systems are negative except as noted in the HPI and PMH.   Patient's Health History    Past Medical History:  Diagnosis Date   Cancer ([place])    COPD (chronic obstructive pulmonary disease) ([place])    Diabetes mellitus without complication ([place])    Diabetes mellitus, type II ([place])    HOH (hard of hearing)    Hyperlipidemia    Hypertension    Sleep apnea     Past Surgical History:  Procedure Laterality Date   ABDOMINAL AORTIC ANEURYSM REPAIR     APPENDECTOMY     THYROIDECTOMY, PARTIAL      Family History  Problem Relation Age of Onset   Diabetes Mother    Hypertension Mother    Hyperlipidemia Mother    Diabetes Father    Thyroid disease Father     Social History   Socioeconomic History   Marital status: Married    Spouse name: Not on file   Number of children: Not on file   Years of education: Not on file   Highest education level: Not on file  Occupational History   Not on file  Tobacco Use   Smoking status: Former    Packs/day: 1.00    Years: 25.00    Total pack years: 25.00    Types: Cigarettes    Quit date: 11/24/2016    Years since quitting: 4.7   Smokeless tobacco: Never   Vaping Use   Vaping Use: Never used  Substance and Sexual Activity   Alcohol use: Not Currently    Comment: "couple a day"   Drug use: Never   Sexual activity: Not on file  Other Topics Concern   Not on file  Social History Narrative   Not on file   Social Determinants of Health   Financial Resource Strain: Not on file  Food Insecurity: Not on file  Transportation Needs: Not on file  Physical Activity: Not on file  Stress: Not on file  Social Connections: Not on file  Intimate Partner Violence: Not on file     Physical Exam   Vitals:   08/15/21 0330 08/15/21 0448  BP: 136/70 136/78  Pulse: (!) 57 (!) 57  Resp: 14 16  Temp:    SpO2: 94% 96%    CONSTITUTIONAL: Well-appearing, NAD NEURO/PSYCH:  Alert and oriented x 3, no focal deficits EYES:  eyes equal and reactive ENT/NECK:  no LAD, no JVD CARDIO: Regular rate, well-perfused, normal S1 and S2 PULM:  CTAB no wheezing or rhonchi GI/GU:  non-distended, non-tender MSK/SPINE:  No gross deformities, no edema SKIN:  no rash, atraumatic   *Additional and/or pertinent findings included in MDM below  Diagnostic and Interventional Summary  EKG Interpretation  Date/Time:  Tuesday August 15 2021 00:19:53 EDT Ventricular Rate:  81 PR Interval:  141 QRS Duration: 84 QT Interval:  338 QTC Calculation: 393 R Axis:   43 Text Interpretation: Sinus rhythm Confirmed by Gerlene Fee [phone]) on 08/15/2021 1:20:31 AM       Labs Reviewed  CBC - Abnormal; Notable for the following components:      Result Value   RBC 3.96 (*)    Hemoglobin 12.4 (*)    HCT 37.1 (*)    All other components within normal limits  COMPREHENSIVE METABOLIC PANEL - Abnormal; Notable for the following components:   Sodium 133 (*)    Glucose, Bld 149 (*)    Calcium 12.3 (*)    AST 56 (*)    Alkaline Phosphatase 229 (*)    All other components within normal limits  POC OCCULT BLOOD, ED  TROPONIN I (HIGH SENSITIVITY)  TROPONIN I (HIGH  SENSITIVITY)    CT Angio Chest Pulmonary Embolism (PE) W or WO Contrast  Final Result    DG Chest Portable 1 View  Final Result    MR THORACIC SPINE W WO CONTRAST    (Results Pending)  MR Lumbar Spine W Wo Contrast    (Results Pending)    Medications  ondansetron (ZOFRAN) injection 4 mg (has no administration in time range)  sodium chloride 0.9 % bolus 1,000 mL (0 mLs Intravenous Stopped 08/15/21 0231)  HYDROmorphone (DILAUDID) injection 0.5 mg (0.5 mg Intravenous Given 08/15/21 0231)  iohexol (OMNIPAQUE) 350 MG/ML injection 100 mL (100 mLs Intravenous Contrast Given 08/15/21 0415)     Procedures  /  Critical Care Procedures  ED Course and Medical Decision Making  Initial Impression and Ddx Differential diagnosis includes dehydration, electrolyte disturbance, PE.  Quite well-appearing on exam, seems a bit confused, unclear baseline.  Vital signs normal while at rest.  Will consider CT PE study if creatinine is within range.  Past medical/surgical history that increases complexity of ED encounter: Metastatic cancer of unknown primary  Interpretation of Diagnostics I personally reviewed the EKG and my interpretation is as follows: Sinus rhythm  Labs overall reassuring with no significant blood count or electrolyte disturbance.  Minimally elevated AST and alk phos.  Minimal hyponatremia.  Hemoglobin with a downtrend from 14 to 12 over the past week or 2.  Hemoccult stool negative.  Patient Reassessment and Ultimate Disposition/Management     Patient was ambulated in reassessment and did not do well, very unsteady, endorsing bilateral leg weakness worsening over the past week or 2.  He has known spinal lesions on PET scan 1 month ago.  Will need MRI to exclude myelopathy.  Signed out to oncoming provider.  Patient management required discussion with the following services or consulting groups:  None  Complexity of Problems Addressed Acute illness or injury that poses threat of life  of bodily function  Additional Data Reviewed and Analyzed Further history obtained from: Further history from spouse/family member  Additional Factors Impacting ED Encounter Risk Use of parenteral controlled substances and Consideration of hospitalization  Barth Kirks. [place] Small, [place] [email or web address]  Final Clinical Impressions(s) / ED Diagnoses     ICD-10-CM   1. Weakness of both lower extremities  R29.898     2. Diffuse pain  R52       ED Discharge Orders     None        Discharge Instructions Discussed with and Provided to  Patient:   Discharge Instructions   None      Maudie Flakes, MD 08/15/21 [phone]

## 2021-08-15 NOTE — ED Notes (Signed)
Pt to MRI

## 2021-08-15 NOTE — ED Provider Notes (Signed)
MRIs of thoracic lumbar show extensive metastatic disease to the bone but no direct spinal cord involvement.  Discussed with his daughter.  Home health is helping at home.  She would like to have him discharged to go home they have follow-up for liver biopsy on July 21 by interventional radiology at [facility] and he has follow-up then with oncology.   Fredia Sorrow, MD 08/15/21 1050

## 2021-08-15 NOTE — ED Notes (Signed)
Pt walking in hallway with walker, belt, and PT

## 2021-08-15 NOTE — ED Notes (Signed)
Returned from MRI 

## 2021-08-15 NOTE — Discharge Instructions (Addendum)
Follow-up with oncology as scheduled and follow-up with interventional radiology at [facility] for the liver biopsy.  Today's MRI showed extensive disease in the bone unknown primary but does not show any evidence of any spinal cord involvement.  Also we will give a trial of the Megace to see if it helps his appetite.  Suspension ordered.

## 2021-08-15 NOTE — Evaluation (Signed)
Physical Therapy Evaluation Patient Details Name: Kevin Summers MRN: [id number] DOB: 01-13-1946 Today's Date: 08/15/2021  History of Present Illness  Kevin Summers is a 76 y.o. year-old male with a history of COPD, diabetes, metastatic cancer presenting to the ED with chief complaint of shortness of breath and weakness.   Clinical Impression  Patient demonstrates labored movement for sitting up at bedside when attempting to pull self up with bed rail, instructed in log rolling technique with fair carryover and Min assist to sit up from side lying position due to back pain/discomfort.  Patient has to lean on  nearby objects for support when using cane, required use of RW for safety and demonstrates good return for ambulating in hallway/room, transferring to/from chair/commode in bathroom using RW without loss of balance.   PLAN:  Patient to be discharged home today and discharged from acute physical therapy to care of nursing for ambulation as tolerated for length of stay with recommendations stated below      Recommendations for follow up therapy are one component of a multi-disciplinary discharge planning process, led by the attending physician.  Recommendations may be updated based on patient status, additional functional criteria and insurance authorization.  Follow Up Recommendations Home health PT      Assistance Recommended at Discharge Set up Supervision/Assistance  Patient can return home with the following  A little help with walking and/or transfers;A little help with bathing/dressing/bathroom;Assistance with cooking/housework;Help with stairs or ramp for entrance    Equipment Recommendations Rolling walker (2 wheels);BSC/3in1  Recommendations for Other Services       Functional Status Assessment Patient has had a recent decline in their functional status and demonstrates the ability to make significant improvements in function in a reasonable and predictable amount of time.      Precautions / Restrictions Precautions Precautions: Fall Restrictions Weight Bearing Restrictions: No      Mobility  Bed Mobility Overal bed mobility: Needs Assistance Bed Mobility: Rolling, Sidelying to Sit Rolling: Min guard Sidelying to sit: Min assist       General bed mobility comments: instructed in log rolling and sitting up from side lying position with fair carryover requiring Min assist    Transfers Overall transfer level: Needs assistance Equipment used: Rolling walker (2 wheels), Straight cane Transfers: Sit to/from Stand, Bed to chair/wheelchair/BSC Sit to Stand: Min guard   Step pivot transfers: Min guard       General transfer comment: unsteady labored movement using SPC having to lean on wall, safer using RW    Ambulation/Gait Ambulation/Gait assistance: Min guard Gait Distance (Feet): 100 Feet Assistive device: Rolling walker (2 wheels) Gait Pattern/deviations: Decreased step length - right, Decreased step length - left, Decreased stride length Gait velocity: decreased     General Gait Details: had to lean on walls when using SPC, safer using RW with good return demonstrated for ambulation in hallways without loss of balance, limited mostly due to c/o fatigue  Stairs            Wheelchair Mobility    Modified Rankin (Stroke Patients Only)       Balance Overall balance assessment: Needs assistance Sitting-balance support: Feet supported, No upper extremity supported Sitting balance-Leahy Scale: Good Sitting balance - Comments: seated at EOB   Standing balance support: Single extremity supported Standing balance-Leahy Scale: Fair Standing balance comment: using SPC, fair/good using RW  Pertinent Vitals/Pain Pain Assessment Pain Assessment: 0-10 Pain Score: 7  Pain Location: spine, low back Pain Descriptors / Indicators: Radiating, Sharp Pain Intervention(s): Limited activity within  patient's tolerance, Monitored during session, Repositioned    Home Living Family/patient expects to be discharged to:: Private residence Living Arrangements: Alone Available Help at Discharge: Family;Available PRN/intermittently Type of Home: House Home Access: Stairs to enter Entrance Stairs-Rails: Right Entrance Stairs-Number of Steps: 2 Alternate Level Stairs-Number of Steps: 12+ Home Layout: Able to live on main level with bedroom/bathroom;Laundry or work area in basement;Two level;Full bath on main level        Prior Function Prior Level of Function : Independent/Modified Independent             Mobility Comments: Household and short distanced community ambulator using [facility] ADLs Comments: assisted by family     Hand Dominance   Dominant Hand: Right    Extremity/Trunk Assessment   Upper Extremity Assessment Upper Extremity Assessment: Overall WFL for tasks assessed    Lower Extremity Assessment Lower Extremity Assessment: Generalized weakness    Cervical / Trunk Assessment Cervical / Trunk Assessment: Normal  Communication   Communication: No difficulties  Cognition Arousal/Alertness: Awake/alert Behavior During Therapy: WFL for tasks assessed/performed Overall Cognitive Status: Within Functional Limits for tasks assessed                                          General Comments      Exercises     Assessment/Plan    PT Assessment All further PT needs can be met in the next venue of care  PT Problem List Decreased strength;Decreased activity tolerance;Decreased balance;Decreased mobility       PT Treatment Interventions      PT Goals (Current goals can be found in the Care Plan section)  Acute Rehab PT Goals Patient Stated Goal: return home with family to assist PT Goal Formulation: With patient/family Time For Goal Achievement: 08/15/21 Potential to Achieve Goals: Good    Frequency       Co-evaluation                AM-PAC PT "6 Clicks" Mobility  Outcome Measure Help needed turning from your back to your side while in a flat bed without using bedrails?: A Little Help needed moving from lying on your back to sitting on the side of a flat bed without using bedrails?: A Lot Help needed moving to and from a bed to a chair (including a wheelchair)?: A Little Help needed standing up from a chair using your arms (e.g., wheelchair or bedside chair)?: A Little Help needed to walk in hospital room?: A Little Help needed climbing 3-5 steps with a railing? : A Little 6 Click Score: 17    End of Session Equipment Utilized During Treatment: Gait belt Activity Tolerance: Patient tolerated treatment well;Patient limited by fatigue Patient left: in chair Nurse Communication: Mobility status PT Visit Diagnosis: Unsteadiness on feet (R26.81);Other abnormalities of gait and mobility (R26.89);Muscle weakness (generalized) (M62.81)    Time: 8756-4332 PT Time Calculation (min) (ACUTE ONLY): 25 min   Charges:   PT Evaluation $PT Eval Moderate Complexity: 1 Mod PT Treatments $Therapeutic Activity: 23-37 mins        12:02 PM, 08/15/21 Lonell Grandchild, MPT Physical Therapist with [facility] 336 [phone] office [phone] mobile phone

## 2021-08-15 NOTE — Progress Notes (Signed)
.  Transition of Care [facility]) - Emergency Department Mini Assessment   Patient Details  Name: Kevin Summers MRN: [id number] Date of Birth: May 20, 1945  Transition of Care [facility]) CM/SW Contact:    Illene Regulus, LCSW Phone Number: 08/15/2021, 10:17 AM   Clinical Narrative: CSW spoke with pt's Daughter Kevin Summers and informed her of PT recommendations for [facility] services ad DME. Ms Kevin Summers stated she would like [facility] services. CSW sent a referral out to [facility] agency. CSW sent a referral out to [facility] for a rolling walker to be delivered to pt's room prior to d/c.   ED Mini Assessment: What brought you to the Emergency Department? 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 [id number]: [phone]: [id number]: [phone]: [phone]

## 2021-08-15 NOTE — ED Notes (Signed)
PT in with pt for eva

## 2021-08-15 NOTE — ED Triage Notes (Addendum)
Pt with increased weakness, dizziness, unsteady gait, and SOB. Pt recently dx with cancer and pain is uncontrollable. Last dose of pain med was 2200.

## 2021-08-16 ENCOUNTER — Ambulatory Visit (HOSPITAL_COMMUNITY): Payer: Medicare Other | Admitting: Hematology

## 2021-08-18 ENCOUNTER — Encounter (HOSPITAL_COMMUNITY): Payer: Self-pay

## 2021-08-21 ENCOUNTER — Encounter (HOSPITAL_COMMUNITY): Payer: Self-pay | Admitting: *Deleted

## 2021-08-21 NOTE — Progress Notes (Signed)
Patient has been accepted by [facility] for Nursing and PT.  Per Myriam Jacobson at [facility], nursing saw patient on Saturday and PT is to follow.  Bed ordered last week via [facility] and is in the process of being scheduled for delivery.  Daughter Kevin Summers updated on the above.

## 2021-08-22 ENCOUNTER — Telehealth (HOSPITAL_COMMUNITY): Payer: Self-pay | Admitting: *Deleted

## 2021-08-22 NOTE — Telephone Encounter (Signed)
[place] Nickel from [facility] called to inform that patient had a fall in the middle of the night, however did not sustain any injuries from the incident.

## 2021-08-24 ENCOUNTER — Other Ambulatory Visit: Payer: Self-pay | Admitting: Student

## 2021-08-24 DIAGNOSIS — K769 Liver disease, unspecified: Secondary | ICD-10-CM

## 2021-08-25 ENCOUNTER — Other Ambulatory Visit: Payer: Self-pay

## 2021-08-25 ENCOUNTER — Ambulatory Visit (HOSPITAL_COMMUNITY)
Admission: RE | Admit: 2021-08-25 | Discharge: 2021-08-25 | Disposition: A | Discharge status: Home / Self Care | Payer: Medicare Other | Source: Ambulatory Visit | Attending: Hematology | Admitting: Hematology

## 2021-08-25 DIAGNOSIS — I10 Essential (primary) hypertension: Secondary | ICD-10-CM | POA: Diagnosis not present

## 2021-08-25 DIAGNOSIS — K769 Liver disease, unspecified: Secondary | ICD-10-CM

## 2021-08-25 DIAGNOSIS — C787 Secondary malignant neoplasm of liver and intrahepatic bile duct: Secondary | ICD-10-CM | POA: Diagnosis present

## 2021-08-25 DIAGNOSIS — I700 Atherosclerosis of aorta: Secondary | ICD-10-CM | POA: Insufficient documentation

## 2021-08-25 DIAGNOSIS — I2510 Atherosclerotic heart disease of native coronary artery without angina pectoris: Secondary | ICD-10-CM | POA: Insufficient documentation

## 2021-08-25 DIAGNOSIS — M5136 Other intervertebral disc degeneration, lumbar region: Secondary | ICD-10-CM | POA: Insufficient documentation

## 2021-08-25 DIAGNOSIS — E119 Type 2 diabetes mellitus without complications: Secondary | ICD-10-CM | POA: Diagnosis not present

## 2021-08-25 DIAGNOSIS — C7951 Secondary malignant neoplasm of bone: Secondary | ICD-10-CM | POA: Diagnosis not present

## 2021-08-25 LAB — CBC
HCT: 37 % — ABNORMAL LOW (ref 39.0–52.0)
Hemoglobin: 12.4 g/dL — ABNORMAL LOW (ref 13.0–17.0)
MCH: 30.7 pg (ref 26.0–34.0)
MCHC: 33.5 g/dL (ref 30.0–36.0)
MCV: 91.6 fL (ref 80.0–100.0)
Platelets: 236 10*3/uL (ref 150–400)
RBC: 4.04 MIL/uL — ABNORMAL LOW (ref 4.22–5.81)
RDW: 13.9 % (ref 11.5–15.5)
WBC: 5.8 10*3/uL (ref 4.0–10.5)
nRBC: 0 % (ref 0.0–0.2)

## 2021-08-25 LAB — PROTIME-INR
INR: 1.1 (ref 0.8–1.2)
Prothrombin Time: 14.4 seconds (ref 11.4–15.2)

## 2021-08-25 MED ORDER — FENTANYL CITRATE (PF) 100 MCG/2ML IJ SOLN
INTRAMUSCULAR | Status: AC
  Filled 2021-08-25: qty 4

## 2021-08-25 MED ORDER — GELATIN ABSORBABLE 12-7 MM EX MISC
CUTANEOUS | Status: AC
  Filled 2021-08-25: qty 1

## 2021-08-25 MED ORDER — FENTANYL CITRATE (PF) 100 MCG/2ML IJ SOLN
INTRAMUSCULAR | Status: AC | PRN
  Administered 2021-08-25: 50 ug via INTRAVENOUS

## 2021-08-25 MED ORDER — LIDOCAINE HCL (PF) 1 % IJ SOLN
INTRAMUSCULAR | Status: AC
  Filled 2021-08-25: qty 30

## 2021-08-25 MED ORDER — HYDROCODONE-ACETAMINOPHEN 5-325 MG PO TABS
1.0000 | ORAL_TABLET | Freq: Once | ORAL | Status: AC
  Administered 2021-08-25: 1 via ORAL
  Filled 2021-08-25: qty 1

## 2021-08-25 MED ORDER — SODIUM CHLORIDE 0.9 % IV SOLN: INTRAVENOUS | Status: DC

## 2021-08-25 MED ORDER — MIDAZOLAM HCL 2 MG/2ML IJ SOLN
INTRAMUSCULAR | Status: AC | PRN
  Administered 2021-08-25 (×2): 1 mg via INTRAVENOUS

## 2021-08-25 MED ORDER — MIDAZOLAM HCL 2 MG/2ML IJ SOLN
INTRAMUSCULAR | Status: AC
  Filled 2021-08-25: qty 4

## 2021-08-25 NOTE — Procedures (Signed)
Interventional Radiology Procedure Note  Procedure: [place] rt liver met 18 g core bx    Complications: None  Estimated Blood Loss:  min  Findings: 18g core x2    M. Daryll Brod, MD

## 2021-08-25 NOTE — H&P (Signed)
Chief Complaint: Patient was seen in consultation today for diffuse metastatic disease at the request of [place]  Referring Physician(s): Katragadda,Sreedhar  Supervising Physician: Daryll Brod  Patient Status: [facility] - Out-pt  History of Present Illness: Kevin Summers is a 76 y.o. male with hx of HTN and DM.  He has had significant weight loss over the past 3 months and radiating back pain.  He has had dizziness, intractable nausea, decreased appetite, cough and SOB, CP and palpitations, pain in gis groin, nocturia, changes in vision.  CT demonstrated multiple masses in the liver, adrenal glands, and bone.  Oncology is asking for biopsy of liver lesion  Past Medical History:  Diagnosis Date   Cancer ([place])    COPD (chronic obstructive pulmonary disease) ([place])    Diabetes mellitus without complication ([place])    Diabetes mellitus, type II ([place])    HOH (hard of hearing)    Hyperlipidemia    Hypertension    Sleep apnea     Past Surgical History:  Procedure Laterality Date   ABDOMINAL AORTIC ANEURYSM REPAIR     APPENDECTOMY     THYROIDECTOMY, PARTIAL      Allergies: Nifedipine and Tramadol  Medications: Prior to Admission medications   Medication Sig Start Date End Date Taking? Authorizing Provider  Cholecalciferol (VITAMIN D) 125 MCG (5000 UT) CAPS Take 5,000 Units by mouth daily.   Yes [provider]  docusate sodium (COLACE) 100 MG capsule Take 100 mg by mouth daily as needed for mild constipation.   Yes [provider]  Dulaglutide (TRULICITY) 1.5 IH/4.7QQ SOPN Trulicity 1.5 VZ/5.6 mL subcutaneous pen injector  INJECT THE CONTENTS OF ONE  PEN SUBCUTANEOUSLY WEEKLY  AS DIRECTED   Yes [provider]  glipiZIDE (GLUCOTROL) 5 MG tablet Take 5 mg by mouth daily. 09/08/17  Yes [provider]  megestrol (MEGACE) 400 MG/10ML suspension Take 20 mLs (800 mg total) by mouth daily. 08/15/21  Yes Fredia Sorrow, MD  metFORMIN  (GLUCOPHAGE) 1000 MG tablet Take 1,000 mg by mouth 2 (two) times daily with a meal.   Yes [provider]  Multiple Vitamins-Minerals (ZINC PO) Take 1 tablet by mouth daily.   Yes [provider]  ondansetron (ZOFRAN) 8 MG tablet Take 1 tablet (8 mg total) by mouth every 8 (eight) hours as needed for nausea or vomiting. 07/12/21  Yes Derek Jack, MD  oxyCODONE-acetaminophen (PERCOCET) 5-325 MG tablet Take 1 tablet by mouth every 4 (four) hours as needed. 07/17/21  Yes Derek Jack, MD  polyethylene glycol (MIRALAX / GLYCOLAX) 17 g packet Take 17 g by mouth daily as needed for mild constipation.   Yes [provider]  aspirin EC 81 MG tablet Take 81 mg by mouth daily.    [provider]  clopidogrel (PLAVIX) 75 MG tablet Take 75 mg by mouth daily.    [provider]  prochlorperazine (COMPAZINE) 10 MG tablet Take 1 tablet (10 mg total) by mouth every 6 (six) hours as needed for nausea or vomiting. Patient not taking: Reported on 08/04/2021 07/12/21   Derek Jack, MD  traMADol (ULTRAM) 50 MG tablet Take 1 tablet (50 mg total) by mouth every 6 (six) hours as needed. Patient not taking: Reported on 08/04/2021 07/18/21   Derek Jack, MD     Family History  Problem Relation Age of Onset   Diabetes Mother    Hypertension Mother    Hyperlipidemia Mother    Diabetes Father    Thyroid disease Father  Social History   Socioeconomic History   Marital status: Married    Spouse name: Not on file   Number of children: Not on file   Years of education: Not on file   Highest education level: Not on file  Occupational History   Not on file  Tobacco Use   Smoking status: Former    Packs/day: 1.00    Years: 25.00    Total pack years: 25.00    Types: Cigarettes    Quit date: 11/24/2016    Years since quitting: 4.7   Smokeless tobacco: Never  Vaping Use   Vaping Use: Never used  Substance and Sexual Activity   Alcohol  use: Not Currently    Comment: "couple a day"   Drug use: Never   Sexual activity: Not on file  Other Topics Concern   Not on file  Social History Narrative   Not on file   Social Determinants of Health   Financial Resource Strain: Not on file  Food Insecurity: Not on file  Transportation Needs: Not on file  Physical Activity: Not on file  Stress: Not on file  Social Connections: Not on file   Review of Systems: A 12 point ROS discussed and pertinent positives are indicated in the HPI above.  All other systems are negative.  Vital Signs: BP (!) 143/58 (BP Location: Right Arm)   Pulse 84   Temp 98.3 F (36.8 C) (Oral)   Ht '5\' 10"'$  (1.778 m)   Wt 165 lb (74.8 kg)   SpO2 98%   BMI 23.68 kg/m   Physical Exam Constitutional:      General: He is not in acute distress.    Appearance: He is not toxic-appearing.  HENT:     Head: Normocephalic and atraumatic.     Mouth/Throat:     Mouth: Mucous membranes are dry.     Pharynx: Oropharynx is clear.  Eyes:     Extraocular Movements: Extraocular movements intact.     Conjunctiva/sclera: Conjunctivae normal.  Cardiovascular:     Rate and Rhythm: Normal rate and regular rhythm.     Pulses: Normal pulses.     Heart sounds: Normal heart sounds.  Pulmonary:     Effort: Pulmonary effort is normal.     Breath sounds: Normal breath sounds.  Abdominal:     General: Abdomen is flat.     Palpations: Abdomen is soft.  Skin:    General: Skin is dry.     Coloration: Skin is pale.  Neurological:     General: No focal deficit present.     Mental Status: He is alert and oriented to person, place, and time.  Psychiatric:        Mood and Affect: Mood normal.        Behavior: Behavior normal.     Imaging: MR THORACIC SPINE W WO CONTRAST  Result Date: 08/15/2021 CLINICAL DATA:  Severe back pain and worsening leg weakness. None spinal metastatic disease. EXAM: MRI THORACIC AND LUMBAR SPINE WITHOUT AND WITH CONTRAST TECHNIQUE:  Multiplanar and multiecho pulse sequences of the thoracic and lumbar spine were obtained without and with intravenous contrast. CONTRAST:  36m GADAVIST GADOBUTROL 1 MMOL/ML IV SOLN COMPARISON:  CT chest from same day.  PET-CT dated July 13, 2021. FINDINGS: MRI THORACIC SPINE FINDINGS Alignment:  Physiologic. Vertebrae: Innumerable marrow replacing lesions involving the thoracic vertebral bodies, with some involvement of the posterior elements. No fracture or epidural tumor extension. Cord: Normal signal and morphology. No abnormal  intrathecal enhancement. Paraspinal and other soft tissues: Unchanged bilateral adrenal masses and multiple liver lesions. Disc levels: No significant disc bulge or herniation.  No stenosis. MRI LUMBAR SPINE FINDINGS Segmentation:  Standard. Alignment:  Physiologic. Vertebrae: Multiple marrow replacing lesions involving the lumbar vertebral bodies and sacrum, with some involvement of the posterior elements. 2.2 cm metastasis in the left L4 transverse process. Minimal L4 superior endplate irregularity, new since May. No epidural tumor extension. Conus medullaris: Extends to the L1 level and appears normal. No abnormal intrathecal enhancement. Paraspinal and other soft tissues: Negative. Disc levels: Mild disc bulging from L2-L3 through L5-S1. No high-grade spinal canal or neuroforaminal stenosis. Mild right lateral recess stenosis at L4-L5. IMPRESSION: 1. Similar extensive osseous metastatic disease in the thoracic and lumbar spine. No epidural tumor extension or canal compromise. 2. Minimal pathologic fracture of the L4 superior endplate. 3. Unchanged adrenal and hepatic metastatic disease. Electronically Signed   By: Titus Dubin M.D.   On: 08/15/2021 09:27   MR Lumbar Spine W Wo Contrast  Result Date: 08/15/2021 CLINICAL DATA:  Severe back pain and worsening leg weakness. None spinal metastatic disease. EXAM: MRI THORACIC AND LUMBAR SPINE WITHOUT AND WITH CONTRAST TECHNIQUE:  Multiplanar and multiecho pulse sequences of the thoracic and lumbar spine were obtained without and with intravenous contrast. CONTRAST:  66m GADAVIST GADOBUTROL 1 MMOL/ML IV SOLN COMPARISON:  CT chest from same day.  PET-CT dated July 13, 2021. FINDINGS: MRI THORACIC SPINE FINDINGS Alignment:  Physiologic. Vertebrae: Innumerable marrow replacing lesions involving the thoracic vertebral bodies, with some involvement of the posterior elements. No fracture or epidural tumor extension. Cord: Normal signal and morphology. No abnormal intrathecal enhancement. Paraspinal and other soft tissues: Unchanged bilateral adrenal masses and multiple liver lesions. Disc levels: No significant disc bulge or herniation.  No stenosis. MRI LUMBAR SPINE FINDINGS Segmentation:  Standard. Alignment:  Physiologic. Vertebrae: Multiple marrow replacing lesions involving the lumbar vertebral bodies and sacrum, with some involvement of the posterior elements. 2.2 cm metastasis in the left L4 transverse process. Minimal L4 superior endplate irregularity, new since May. No epidural tumor extension. Conus medullaris: Extends to the L1 level and appears normal. No abnormal intrathecal enhancement. Paraspinal and other soft tissues: Negative. Disc levels: Mild disc bulging from L2-L3 through L5-S1. No high-grade spinal canal or neuroforaminal stenosis. Mild right lateral recess stenosis at L4-L5. IMPRESSION: 1. Similar extensive osseous metastatic disease in the thoracic and lumbar spine. No epidural tumor extension or canal compromise. 2. Minimal pathologic fracture of the L4 superior endplate. 3. Unchanged adrenal and hepatic metastatic disease. Electronically Signed   By: WTitus DubinM.D.   On: 08/15/2021 09:27   CT Angio Chest Pulmonary Embolism (PE) W or WO Contrast  Result Date: 08/15/2021 CLINICAL DATA:  76year old male with history of worsening weakness and dizziness. Unsteady gait. Shortness of breath. Widespread metastatic  disease noted on recent PET-CT. * Tracking Code: BO * EXAM: CT ANGIOGRAPHY CHEST WITH CONTRAST TECHNIQUE: Multidetector CT imaging of the chest was performed using the standard protocol during bolus administration of intravenous contrast. Multiplanar CT image reconstructions and MIPs were obtained to evaluate the vascular anatomy. RADIATION DOSE REDUCTION: This exam was performed according to the departmental dose-optimization program which includes automated exposure control, adjustment of the mA and/or kV according to patient size and/or use of iterative reconstruction technique. CONTRAST:  1019mOMNIPAQUE IOHEXOL 350 MG/ML SOLN COMPARISON:  PET-CT 07/13/2021. FINDINGS: Cardiovascular: No there are no filling defects in the pulmonary arterial tree to suggest pulmonary  embolism. Heart size is normal. There is no significant pericardial fluid, thickening or pericardial calcification. There is aortic atherosclerosis, as well as atherosclerosis of the great vessels of the mediastinum and the coronary arteries, including calcified atherosclerotic plaque in the left anterior descending and right coronary arteries. Mediastinum/Nodes: No pathologically enlarged mediastinal or hilar lymph nodes. Status post right thyroidectomy. Enlargement of the left lobe of the thyroid gland which is heterogeneous in appearance with multiple small calcifications. 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 Esophagus is unremarkable in appearance. No axillary lymphadenopathy. Lungs/Pleura: Calcified granuloma in the left lower lobe. No other suspicious appearing pulmonary nodules or masses are noted. No acute consolidative airspace disease. No pleural effusions. Upper Abdomen: Liver is enlarged and heterogeneous in appearance with multiple  lesions which appear to heterogeneously enhance with central hypoenhancing regions likely necrosis, largest of which in the visualize liver is in the periphery of segment 2 (axial image 35 of series 5) measuring 4.4 x 3.9 cm. Musculoskeletal: Multiple lytic lesions are noted throughout the visualized skeleton, largest of which is in the right side of the T1 vertebral body measuring 1.8 cm (coronal image 83 of series 8). There is also a lytic lesion with disruption of the cortex of the right humeral head (axial image 18 of series 5). Review of the MIP images confirms the above findings. IMPRESSION: 1. No evidence of pulmonary embolism. 2. Widespread metastatic disease to the liver and bones, as above. 3. No evidence of intrapulmonary metastasis. 4. 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 5. Aortic atherosclerosis, in addition to two-vessel coronary artery disease. Assessment for potential risk factor modification, dietary therapy or pharmacologic therapy may be warranted, if clinically indicated. Aortic Atherosclerosis (ICD10-I70.0). Electronically Signed   By: Vinnie Langton M.D.   On: 08/15/2021 05:12   DG Chest Portable 1 View  Result Date: 08/15/2021 CLINICAL DATA:  Shortness of breath, weakness EXAM: PORTABLE CHEST 1 VIEW COMPARISON:  CT chest dated 07/01/2021 FINDINGS: Lungs are clear.  No pleural effusion or pneumothorax. 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 The heart is normal in size. IMPRESSION: No evidence of acute cardiopulmonary disease. Electronically Signed   By: Julian Hy M.D.   On: 08/15/2021 00:56    Labs:  CBC: Recent Labs    06/30/21 2257 07/12/21 0949 08/15/21 0131 08/25/21 1140  WBC 10.1 7.3  5.2 5.8  HGB 14.1 14.1 12.4* 12.4*  HCT 41.4 42.1 37.1* 37.0*  PLT 279 264 191 236    COAGS: Recent Labs    08/25/21 1140  INR 1.1    BMP: Recent Labs    06/30/21 2257 07/12/21 0949 08/15/21 0131  NA 131* 129* 133*  K 4.9 4.5 4.4  CL 100 95* 100  CO2 21* 23 25  GLUCOSE 104* 143* 149*  BUN 54* 37* 20  CALCIUM 9.9 10.4* 12.3*  CREATININE 2.03* 1.35* 0.96  GFRNONAA 34* 55* >60    LIVER FUNCTION TESTS: Recent Labs    06/30/21 2257 07/12/21 0949 08/15/21 0131  BILITOT 0.6 1.0 1.1  AST 59* 56* 56*  ALT  41 40 31  ALKPHOS 152* 182* 229*  PROT 7.9 8.3* 7.6  ALBUMIN 4.6 4.6 3.8    Assessment and Plan:  Diffuse metastatic disease --with liver lesions --history, imaging, vitals, and labs reviewed --for image guided liver biopsy with moderate sedation and planned d/c home with daughter later this afternoon.  Risks and benefits of liver biopsy was discussed with the patient and/or patient's family including, but not limited to bleeding, infection, damage to adjacent structures or low yield requiring additional tests.  All of the questions were answered and there is agreement to proceed.  Consent signed by daughter as per patient's preference, and in chart.   Thank you for this interesting consult.  I greatly enjoyed meeting Kevin Summers and look forward to participating in their care.  A copy of this report was sent to the requesting provider on this date.  Electronically Signed: Pasty Spillers, PA 08/25/2021, 12:47 PM   I spent a total of 30 Minutes  in face to face in clinical consultation, greater than 50% of which was counseling/coordinating care for metastatic disease.

## 2021-08-28 ENCOUNTER — Telehealth (HOSPITAL_COMMUNITY): Payer: Self-pay | Admitting: *Deleted

## 2021-08-28 ENCOUNTER — Other Ambulatory Visit (HOSPITAL_COMMUNITY): Payer: Self-pay

## 2021-08-28 MED ORDER — OXYCODONE-ACETAMINOPHEN 5-325 MG PO TABS
1.0000 | ORAL_TABLET | ORAL | 0 refills | Status: AC | PRN
Start: 2021-08-28 — End: ?

## 2021-08-28 NOTE — Telephone Encounter (Signed)
Received message from daughter, Ginger who has advised that he has had a sever decline in health over the past week and is not eating or drinking and they are requesting a referral to [facility].  Referral sent today and contact information given to her.

## 2021-08-30 ENCOUNTER — Ambulatory Visit (HOSPITAL_COMMUNITY): Payer: Medicare Other | Admitting: Hematology

## 2021-08-30 LAB — SURGICAL PATHOLOGY

## 2021-09-05 ENCOUNTER — Encounter: Payer: Self-pay | Admitting: Surgery

## 2021-09-05 NOTE — Progress Notes (Signed)
I called the pt's daughter, Marko Plume, to be able to complete the FMLA papers that she requested.  Ginger notified me that her father passed away last 28-Sep-2022.  She requested that her FMLA just cover her missing work on 08/31/21 when she was caring for her father.  I faxed the FMLA papers to [employer] and received a confirmation fax.  Ginger also requested that the hospital bed be removed from her father's house.  I notified Renda Rolls, RN, who has contacted the company to set up a date and time to remove the bed.

## 2021-09-05 DEATH — deceased
